# Patient Record
Sex: Female | Born: 1970
Health system: Southern US, Community
[De-identification: ages and names within clinical notes are randomized; demographics above are authoritative.]

## PROBLEM LIST (undated history)

## (undated) DIAGNOSIS — R87629 Unspecified abnormal cytological findings in specimens from vagina: Secondary | ICD-10-CM

## (undated) DIAGNOSIS — E538 Deficiency of other specified B group vitamins: Secondary | ICD-10-CM

## (undated) DIAGNOSIS — T7840XA Allergy, unspecified, initial encounter: Secondary | ICD-10-CM

## (undated) DIAGNOSIS — G4730 Sleep apnea, unspecified: Secondary | ICD-10-CM

## (undated) DIAGNOSIS — F419 Anxiety disorder, unspecified: Secondary | ICD-10-CM

## (undated) DIAGNOSIS — S73004A Unspecified dislocation of right hip, initial encounter: Secondary | ICD-10-CM

## (undated) DIAGNOSIS — E785 Hyperlipidemia, unspecified: Secondary | ICD-10-CM

## (undated) DIAGNOSIS — F32A Depression, unspecified: Secondary | ICD-10-CM

## (undated) DIAGNOSIS — M549 Dorsalgia, unspecified: Secondary | ICD-10-CM

## (undated) DIAGNOSIS — M810 Age-related osteoporosis without current pathological fracture: Secondary | ICD-10-CM

## (undated) DIAGNOSIS — E669 Obesity, unspecified: Secondary | ICD-10-CM

## (undated) DIAGNOSIS — J019 Acute sinusitis, unspecified: Secondary | ICD-10-CM

## (undated) DIAGNOSIS — M2550 Pain in unspecified joint: Secondary | ICD-10-CM

## (undated) DIAGNOSIS — G4700 Insomnia, unspecified: Secondary | ICD-10-CM

## (undated) DIAGNOSIS — E559 Vitamin D deficiency, unspecified: Secondary | ICD-10-CM

## (undated) DIAGNOSIS — Z903 Acquired absence of stomach [part of]: Secondary | ICD-10-CM

## (undated) DIAGNOSIS — Z720 Tobacco use: Secondary | ICD-10-CM

## (undated) DIAGNOSIS — M5090 Cervical disc disorder, unspecified, unspecified cervical region: Secondary | ICD-10-CM

## (undated) DIAGNOSIS — G250 Essential tremor: Secondary | ICD-10-CM

## (undated) DIAGNOSIS — G609 Hereditary and idiopathic neuropathy, unspecified: Secondary | ICD-10-CM

## (undated) DIAGNOSIS — F329 Major depressive disorder, single episode, unspecified: Secondary | ICD-10-CM

## (undated) DIAGNOSIS — IMO0002 Reserved for concepts with insufficient information to code with codable children: Secondary | ICD-10-CM

## (undated) DIAGNOSIS — S82839B Other fracture of upper and lower end of unspecified fibula, initial encounter for open fracture type I or II: Secondary | ICD-10-CM

## (undated) DIAGNOSIS — G252 Other specified forms of tremor: Secondary | ICD-10-CM

## (undated) DIAGNOSIS — K5900 Constipation, unspecified: Secondary | ICD-10-CM

## (undated) HISTORY — DX: Other fracture of upper and lower end of unspecified fibula, initial encounter for open fracture type I or II: S82.839B

## (undated) HISTORY — DX: Tobacco use: Z72.0

## (undated) HISTORY — PX: ARTHROSCOPIC REPAIR ACL: SUR80

## (undated) HISTORY — DX: Essential tremor: G25.0

## (undated) HISTORY — DX: Unspecified abnormal cytological findings in specimens from vagina: R87.629

## (undated) HISTORY — DX: Obesity, unspecified: E66.9

## (undated) HISTORY — DX: Age-related osteoporosis without current pathological fracture: M81.0

## (undated) HISTORY — DX: Cervical disc disorder, unspecified, unspecified cervical region: M50.90

## (undated) HISTORY — DX: Insomnia, unspecified: G47.00

## (undated) HISTORY — DX: Pain in unspecified joint: M25.50

## (undated) HISTORY — DX: Acute sinusitis, unspecified: J01.90

## (undated) HISTORY — DX: Hereditary and idiopathic neuropathy, unspecified: G60.9

## (undated) HISTORY — PX: TUBAL LIGATION: SHX77

## (undated) HISTORY — DX: Acquired absence of stomach (part of): Z90.3

## (undated) HISTORY — DX: Vitamin D deficiency, unspecified: E55.9

## (undated) HISTORY — DX: Deficiency of other specified B group vitamins: E53.8

## (undated) HISTORY — DX: Constipation, unspecified: K59.00

## (undated) HISTORY — DX: Hyperlipidemia, unspecified: E78.5

## (undated) HISTORY — DX: Anxiety disorder, unspecified: F41.9

## (undated) HISTORY — DX: Dorsalgia, unspecified: M54.9

## (undated) HISTORY — DX: Allergy, unspecified, initial encounter: T78.40XA

## (undated) HISTORY — PX: TONSILLECTOMY: SUR1361

## (undated) HISTORY — DX: Essential tremor: G25.2

## (undated) HISTORY — DX: Reserved for concepts with insufficient information to code with codable children: IMO0002

---
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
## 1998-10-18 ENCOUNTER — Other Ambulatory Visit: Admission: RE | Admit: 1998-10-18 | Discharge: 1998-10-18 | Payer: Self-pay | Admitting: Gynecology

## 1999-11-28 ENCOUNTER — Other Ambulatory Visit: Admission: RE | Admit: 1999-11-28 | Discharge: 1999-11-28 | Payer: Self-pay | Admitting: Obstetrics & Gynecology

## 2000-12-02 ENCOUNTER — Other Ambulatory Visit: Admission: RE | Admit: 2000-12-02 | Discharge: 2000-12-02 | Payer: Self-pay | Admitting: Obstetrics and Gynecology

## 2000-12-29 ENCOUNTER — Encounter: Payer: Self-pay | Admitting: Internal Medicine

## 2000-12-29 ENCOUNTER — Ambulatory Visit (HOSPITAL_COMMUNITY): Admission: RE | Admit: 2000-12-29 | Discharge: 2000-12-29 | Payer: Self-pay | Admitting: Internal Medicine

## 2001-12-22 ENCOUNTER — Other Ambulatory Visit: Admission: RE | Admit: 2001-12-22 | Discharge: 2001-12-22 | Payer: Self-pay | Admitting: Obstetrics and Gynecology

## 2003-02-01 ENCOUNTER — Other Ambulatory Visit: Admission: RE | Admit: 2003-02-01 | Discharge: 2003-02-01 | Payer: Self-pay | Admitting: Obstetrics and Gynecology

## 2003-03-04 ENCOUNTER — Ambulatory Visit (HOSPITAL_COMMUNITY): Admission: RE | Admit: 2003-03-04 | Discharge: 2003-03-04 | Payer: Self-pay | Admitting: Obstetrics and Gynecology

## 2004-02-20 ENCOUNTER — Other Ambulatory Visit: Admission: RE | Admit: 2004-02-20 | Discharge: 2004-02-20 | Payer: Self-pay | Admitting: Obstetrics and Gynecology

## 2004-12-19 ENCOUNTER — Inpatient Hospital Stay (HOSPITAL_COMMUNITY): Admission: AD | Admit: 2004-12-19 | Discharge: 2004-12-19 | Payer: Self-pay | Admitting: Obstetrics and Gynecology

## 2005-06-10 ENCOUNTER — Other Ambulatory Visit: Admission: RE | Admit: 2005-06-10 | Discharge: 2005-06-10 | Payer: Self-pay | Admitting: Obstetrics and Gynecology

## 2008-12-16 HISTORY — PX: OTHER SURGICAL HISTORY: SHX169

## 2008-12-20 ENCOUNTER — Inpatient Hospital Stay (HOSPITAL_COMMUNITY): Admission: EM | Admit: 2008-12-20 | Discharge: 2008-12-23 | Payer: Self-pay | Admitting: Emergency Medicine

## 2009-01-30 ENCOUNTER — Encounter: Admission: RE | Admit: 2009-01-30 | Discharge: 2009-01-30 | Payer: Self-pay | Admitting: Orthopedic Surgery

## 2009-03-15 ENCOUNTER — Encounter: Admission: RE | Admit: 2009-03-15 | Discharge: 2009-03-15 | Payer: Self-pay | Admitting: Orthopedic Surgery

## 2011-01-06 ENCOUNTER — Encounter: Payer: Self-pay | Admitting: Orthopedic Surgery

## 2011-04-01 LAB — URINE MICROSCOPIC-ADD ON

## 2011-04-01 LAB — URINALYSIS, ROUTINE W REFLEX MICROSCOPIC
Glucose, UA: NEGATIVE mg/dL
Leukocytes, UA: NEGATIVE
Protein, ur: NEGATIVE mg/dL
Specific Gravity, Urine: 1.043 — ABNORMAL HIGH (ref 1.005–1.030)
pH: 8 (ref 5.0–8.0)

## 2011-04-01 LAB — BASIC METABOLIC PANEL
BUN: 9 mg/dL (ref 6–23)
Calcium: 8.6 mg/dL (ref 8.4–10.5)
Creatinine, Ser: 0.48 mg/dL (ref 0.4–1.2)
GFR calc Af Amer: >60 mL/min (ref >60)
GFR calc non Af Amer: >60 mL/min (ref >60)

## 2011-04-01 LAB — CBC
MCV: 90.4 fL (ref 78.0–100.0)
Platelets: 320 10*3/uL (ref 150–400)
RBC: 4.4 MIL/uL (ref 3.87–5.11)
WBC: 20.9 10*3/uL — ABNORMAL HIGH (ref 4.0–10.5)

## 2011-04-01 LAB — DIFFERENTIAL
Eosinophils Absolute: 0 10*3/uL (ref 0.0–0.7)
Lymphs Abs: 1 10*3/uL (ref 0.7–4.0)
Monocytes Relative: 2 % — ABNORMAL LOW (ref 3–12)
Neutro Abs: 19.4 10*3/uL — ABNORMAL HIGH (ref 1.7–7.7)
Neutrophils Relative %: 93 % — ABNORMAL HIGH (ref 43–77)

## 2011-04-01 LAB — PROTIME-INR
INR: 1.1 (ref 0.00–1.49)
Prothrombin Time: 14.1 seconds (ref 11.6–15.2)

## 2011-04-01 LAB — POCT PREGNANCY, URINE: Preg Test, Ur: NEGATIVE

## 2011-04-01 LAB — APTT: aPTT: 30 seconds (ref 24–37)
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
## 2011-05-03 NOTE — Op Note (Signed)
   [name], [name]                        ACCOUNT NO.:  [id number]   MEDICAL RECORD NO.:  [id number]                   PATIENT TYPE:  AMB   LOCATION:  SDC                                  FACILITY:  [facility]   PHYSICIAN:  Michelle L. Vincente Poli, M.D.            DATE OF BIRTH:  21-Aug-1971   DATE OF PROCEDURE:  03/04/2003  DATE OF DISCHARGE:                                 OPERATIVE REPORT   PREOPERATIVE DIAGNOSIS:  Desires permanent sterilization.   POSTOPERATIVE DIAGNOSIS:  Desires permanent sterilization.   PROCEDURE:  Laparoscopic bilateral tubal ligation.   SURGEON:  Michelle L. Vincente Poli, M.D.   ANESTHESIA:  General.   ESTIMATED BLOOD LOSS:  None.   FINDINGS:  Normal pelvis.   DESCRIPTION OF PROCEDURE:  The patient taken to the operating room after  informed consent was obtained.  The patient was intubated without  difficulty.  The patient was then prepped and draped in the usual sterile  fashion after in-and-out catheter was used to empty the bladder and a  uterine manipulator was inserted inside.  The scalpel was used to make a  infraumbilical incision.  The Veress needle was inserted with one attempt  without difficulty.  A pneumoperitoneum was then achieved.  The Veress  needle was removed and the 10-11 mm trocar was inserted without difficulty.  The laparoscope was introduced and the patient was placed in Trendelenburg  position.  There was no bleeding or intestinal injury noted.  There were no  adhesions noted.  The pelvis appeared very normal.  The adnexa appeared  normal, bilateral fimbriated end were easily seen.  The secondary trocar  site was placed using the 5-mm trocar and use of Kleppingers.  Bilateral  tubal ligation was performed with a triple burn to ensure that the wattage  went down to 0 with very good effect.  Photographs were then taken.  There  was no bleeding noted.  Instruments were removed from the abdomen after  pneumoperitoneum was released.  Using  3-0 Vicryl interrupted, incisions were  then closed and local was then injected at each incision site and pressure  dressing was placed at each incision site.  All sponge, lap, and instrument  counts were correct x2.  The uterine manipulator was removed from the vagina  prior to extubation.  The patient was extubated and went to the recovery  room in stable condition.                                               Michelle L. Vincente Poli, M.D.    [name]  D:  03/04/2003  T:  03/05/2003  Job:  [id number]
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
## 2012-05-21 ENCOUNTER — Emergency Department (HOSPITAL_COMMUNITY)
Admission: EM | Admit: 2012-05-21 | Discharge: 2012-05-21 | Disposition: A | Discharge status: Home / Self Care | Payer: 59 | Source: Home / Self Care | Attending: Emergency Medicine | Admitting: Emergency Medicine

## 2012-05-21 ENCOUNTER — Emergency Department (INDEPENDENT_AMBULATORY_CARE_PROVIDER_SITE_OTHER): Payer: 59

## 2012-05-21 ENCOUNTER — Encounter (HOSPITAL_COMMUNITY): Payer: Self-pay | Admitting: Emergency Medicine

## 2012-05-21 DIAGNOSIS — S8000XA Contusion of unspecified knee, initial encounter: Secondary | ICD-10-CM

## 2012-05-21 DIAGNOSIS — S8010XA Contusion of unspecified lower leg, initial encounter: Secondary | ICD-10-CM

## 2012-05-21 DIAGNOSIS — S8001XA Contusion of right knee, initial encounter: Secondary | ICD-10-CM

## 2012-05-21 DIAGNOSIS — S2020XA Contusion of thorax, unspecified, initial encounter: Secondary | ICD-10-CM

## 2012-05-21 HISTORY — DX: Depression, unspecified: F32.A

## 2012-05-21 HISTORY — DX: Major depressive disorder, single episode, unspecified: F32.9

## 2012-05-21 HISTORY — DX: Unspecified dislocation of right hip, initial encounter: S73.004A

## 2012-05-21 MED ORDER — MELOXICAM 15 MG PO TABS: 15.0000 mg | ORAL_TABLET | Freq: Every day | ORAL | Status: DC

## 2012-05-21 MED ORDER — METAXALONE 800 MG PO TABS: 800.0000 mg | ORAL_TABLET | Freq: Three times a day (TID) | ORAL | Status: AC

## 2012-05-21 MED ORDER — OXYCODONE-ACETAMINOPHEN 5-325 MG PO TABS: 1.0000 | ORAL_TABLET | Freq: Four times a day (QID) | ORAL | Status: AC | PRN
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
## 2012-05-21 NOTE — Discharge Instructions (Signed)
Take the medication as written. Take 1 gram of tylenol with the motrin up to 4 times a day as needed for pain and fever. This with the meloxicam is an effective combination for pain. Take the percocet only for severe pain. Do not take the tylenol and percocet as they both have tylenol in them and too much can hurt your liver. Do not exceed 4 grams of tylenol a day from all sources. Return if you get worse, have a  fever >100.4, or for any concerns.   Go to www.goodrx.com to look up your medications. This will give you a list of where you can find your prescriptions at the most affordable prices.   

## 2012-05-21 NOTE — ED Notes (Signed)
Crutches not fitted and splint not applied prior to dc at pt request, as she is a [employer] fund[employer] herself and has to drive herself home (not able w splint on knee) related she felt comfortable w the idea of adjusting and application the appliances herself
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
## 2012-05-22 ENCOUNTER — Telehealth (HOSPITAL_COMMUNITY): Payer: Self-pay | Admitting: *Deleted

## 2012-05-22 NOTE — ED Notes (Signed)
Pt. called for work note. Dr. told her 2 days but note not in her papers. I told her she could come pick it up at the front desk.  Discussed with Dr. Chaney Malling and she approved 2 days. Note done as directed and put at the front desk. [name] 05/22/2012

## 2012-05-25 ENCOUNTER — Ambulatory Visit
Admission: RE | Admit: 2012-05-25 | Discharge: 2012-05-25 | Disposition: A | Discharge status: Home / Self Care | Payer: 59 | Source: Ambulatory Visit | Attending: Chiropractic Medicine | Admitting: Chiropractic Medicine

## 2012-05-25 ENCOUNTER — Other Ambulatory Visit: Payer: Self-pay | Admitting: Chiropractic Medicine

## 2012-05-25 DIAGNOSIS — M5412 Radiculopathy, cervical region: Secondary | ICD-10-CM

## 2012-05-25 DIAGNOSIS — M545 Low back pain, unspecified: Secondary | ICD-10-CM

## 2012-05-25 DIAGNOSIS — M25551 Pain in right hip: Secondary | ICD-10-CM

## 2012-05-25 DIAGNOSIS — R079 Chest pain, unspecified: Secondary | ICD-10-CM

## 2012-06-23 ENCOUNTER — Encounter: Payer: Self-pay | Admitting: Specialist

## 2012-07-16 ENCOUNTER — Encounter: Payer: Self-pay | Admitting: Specialist

## 2013-03-30 ENCOUNTER — Other Ambulatory Visit: Payer: Self-pay | Admitting: *Deleted

## 2013-03-30 ENCOUNTER — Other Ambulatory Visit: Payer: 59

## 2013-03-30 DIAGNOSIS — Z13220 Encounter for screening for lipoid disorders: Secondary | ICD-10-CM

## 2013-03-30 DIAGNOSIS — M948X9 Other specified disorders of cartilage, unspecified sites: Secondary | ICD-10-CM

## 2013-03-30 DIAGNOSIS — I10 Essential (primary) hypertension: Secondary | ICD-10-CM

## 2013-03-30 DIAGNOSIS — J301 Allergic rhinitis due to pollen: Secondary | ICD-10-CM

## 2013-03-31 LAB — CBC WITH DIFFERENTIAL/PLATELET
Eos: 2 % (ref 0–5)
HCT: 39.7 % (ref 34.0–46.6)
Immature Grans (Abs): 0 10*3/uL (ref 0.0–0.1)
Immature Granulocytes: 0 % (ref 0–2)
Lymphocytes Absolute: 2.2 10*3/uL (ref 0.7–3.1)
Monocytes: 8 % (ref 4–12)
Neutrophils Relative %: 60 % (ref 40–74)
RDW: 13.3 % (ref 12.3–15.4)
WBC: 7.3 10*3/uL (ref 3.4–10.8)

## 2013-03-31 LAB — COMPREHENSIVE METABOLIC PANEL
ALT: 7 IU/L (ref 0–32)
Albumin/Globulin Ratio: 1.9 (ref 1.1–2.5)
Alkaline Phosphatase: 59 IU/L (ref 39–117)
BUN/Creatinine Ratio: 16 (ref 9–23)
GFR calc Af Amer: 126 mL/min/{1.73_m2} (ref >59)
GFR calc non Af Amer: 110 mL/min/{1.73_m2} (ref >59)
Potassium: 5.1 mmol/L (ref 3.5–5.2)
Sodium: 142 mmol/L (ref 134–144)
Total Bilirubin: 0.3 mg/dL (ref 0.0–1.2)
Total Protein: 6.1 g/dL (ref 6.0–8.5)

## 2013-03-31 LAB — LIPID PANEL: VLDL Cholesterol Cal: 21 mg/dL (ref 5–40)

## 2013-03-31 LAB — VITAMIN D 25 HYDROXY (VIT D DEFICIENCY, FRACTURES): Vit D, 25-Hydroxy: 18.5 ng/mL — ABNORMAL LOW (ref 30.0–100.0)

## 2013-04-12 ENCOUNTER — Encounter: Payer: Self-pay | Admitting: *Deleted

## 2013-04-13 ENCOUNTER — Ambulatory Visit (INDEPENDENT_AMBULATORY_CARE_PROVIDER_SITE_OTHER): Payer: 59 | Admitting: Internal Medicine

## 2013-04-13 ENCOUNTER — Encounter: Payer: Self-pay | Admitting: Internal Medicine

## 2013-04-13 VITALS — BP 124/72 | HR 67 | Temp 98.6°F | Resp 14 | Ht 61.0 in | Wt 213.0 lb

## 2013-04-13 DIAGNOSIS — F32A Depression, unspecified: Secondary | ICD-10-CM

## 2013-04-13 DIAGNOSIS — E559 Vitamin D deficiency, unspecified: Secondary | ICD-10-CM

## 2013-04-13 DIAGNOSIS — F329 Major depressive disorder, single episode, unspecified: Secondary | ICD-10-CM

## 2013-04-13 DIAGNOSIS — F17200 Nicotine dependence, unspecified, uncomplicated: Secondary | ICD-10-CM

## 2013-04-13 DIAGNOSIS — Z720 Tobacco use: Secondary | ICD-10-CM

## 2013-04-13 DIAGNOSIS — Z0000 Encounter for general adult medical examination without abnormal findings: Secondary | ICD-10-CM

## 2013-04-13 DIAGNOSIS — G4700 Insomnia, unspecified: Secondary | ICD-10-CM

## 2013-04-13 DIAGNOSIS — F411 Generalized anxiety disorder: Secondary | ICD-10-CM

## 2013-04-13 DIAGNOSIS — F3289 Other specified depressive episodes: Secondary | ICD-10-CM

## 2013-04-13 MED ORDER — SERTRALINE HCL 100 MG PO TABS: 100.0000 mg | ORAL_TABLET | Freq: Every day | ORAL | Status: DC

## 2013-04-13 MED ORDER — VITAMIN D3 1.25 MG (50000 UT) PO CAPS: 1.0000 | ORAL_CAPSULE | ORAL | Status: DC
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
## 2013-04-14 DIAGNOSIS — E559 Vitamin D deficiency, unspecified: Secondary | ICD-10-CM | POA: Insufficient documentation

## 2013-04-14 DIAGNOSIS — F329 Major depressive disorder, single episode, unspecified: Secondary | ICD-10-CM | POA: Insufficient documentation

## 2013-04-14 DIAGNOSIS — Z720 Tobacco use: Secondary | ICD-10-CM | POA: Insufficient documentation

## 2013-04-14 DIAGNOSIS — F32A Depression, unspecified: Secondary | ICD-10-CM | POA: Insufficient documentation

## 2013-04-14 DIAGNOSIS — F411 Generalized anxiety disorder: Secondary | ICD-10-CM | POA: Insufficient documentation

## 2013-04-14 DIAGNOSIS — G4700 Insomnia, unspecified: Secondary | ICD-10-CM | POA: Insufficient documentation

## 2013-04-14 DIAGNOSIS — Z0000 Encounter for general adult medical examination without abnormal findings: Secondary | ICD-10-CM | POA: Insufficient documentation

## 2013-06-19 ENCOUNTER — Other Ambulatory Visit: Payer: Self-pay | Admitting: Nurse Practitioner

## 2013-06-22 ENCOUNTER — Other Ambulatory Visit: Payer: Self-pay | Admitting: Geriatric Medicine

## 2013-06-22 ENCOUNTER — Other Ambulatory Visit: Payer: Self-pay | Admitting: Nurse Practitioner

## 2013-09-04 ENCOUNTER — Other Ambulatory Visit: Payer: Self-pay | Admitting: Internal Medicine

## 2013-09-10 ENCOUNTER — Other Ambulatory Visit: Payer: Self-pay | Admitting: Internal Medicine

## 2013-10-12 ENCOUNTER — Encounter: Payer: Self-pay | Admitting: Internal Medicine

## 2013-10-12 ENCOUNTER — Ambulatory Visit (INDEPENDENT_AMBULATORY_CARE_PROVIDER_SITE_OTHER): Payer: 59 | Admitting: Internal Medicine

## 2013-10-12 VITALS — BP 130/86 | HR 94 | Temp 98.2°F | Resp 16 | Wt 222.2 lb

## 2013-10-12 DIAGNOSIS — G4700 Insomnia, unspecified: Secondary | ICD-10-CM

## 2013-10-12 DIAGNOSIS — F17200 Nicotine dependence, unspecified, uncomplicated: Secondary | ICD-10-CM

## 2013-10-12 DIAGNOSIS — F32A Depression, unspecified: Secondary | ICD-10-CM

## 2013-10-12 DIAGNOSIS — E559 Vitamin D deficiency, unspecified: Secondary | ICD-10-CM

## 2013-10-12 DIAGNOSIS — Z720 Tobacco use: Secondary | ICD-10-CM

## 2013-10-12 DIAGNOSIS — F3289 Other specified depressive episodes: Secondary | ICD-10-CM

## 2013-10-12 DIAGNOSIS — F329 Major depressive disorder, single episode, unspecified: Secondary | ICD-10-CM

## 2013-10-12 DIAGNOSIS — F411 Generalized anxiety disorder: Secondary | ICD-10-CM

## 2013-10-12 MED ORDER — ALPRAZOLAM 0.5 MG PO TABS: ORAL_TABLET | ORAL | Status: DC

## 2013-10-12 MED ORDER — ZOLPIDEM TARTRATE 10 MG PO TABS: ORAL_TABLET | ORAL | Status: DC

## 2013-10-12 MED ORDER — SERTRALINE HCL 100 MG PO TABS
150.0000 mg | ORAL_TABLET | Freq: Every day | ORAL | Status: DC
Start: 2013-10-12 — End: 2014-01-26
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
## 2013-12-16 DIAGNOSIS — IMO0002 Reserved for concepts with insufficient information to code with codable children: Secondary | ICD-10-CM

## 2013-12-16 HISTORY — DX: Reserved for concepts with insufficient information to code with codable children: IMO0002

## 2013-12-24 ENCOUNTER — Other Ambulatory Visit: Payer: Self-pay | Admitting: Internal Medicine

## 2013-12-24 DIAGNOSIS — J069 Acute upper respiratory infection, unspecified: Secondary | ICD-10-CM

## 2013-12-24 MED ORDER — AZITHROMYCIN 250 MG PO TABS: ORAL_TABLET | ORAL | Status: DC

## 2014-01-26 ENCOUNTER — Ambulatory Visit (INDEPENDENT_AMBULATORY_CARE_PROVIDER_SITE_OTHER): Payer: 59 | Admitting: Internal Medicine

## 2014-01-26 ENCOUNTER — Encounter: Payer: Self-pay | Admitting: Internal Medicine

## 2014-01-26 VITALS — BP 122/70 | HR 92 | Temp 98.5°F | Resp 18 | Ht 61.0 in | Wt 224.8 lb

## 2014-01-26 DIAGNOSIS — G4700 Insomnia, unspecified: Secondary | ICD-10-CM

## 2014-01-26 DIAGNOSIS — R200 Anesthesia of skin: Secondary | ICD-10-CM | POA: Insufficient documentation

## 2014-01-26 DIAGNOSIS — M62838 Other muscle spasm: Secondary | ICD-10-CM

## 2014-01-26 DIAGNOSIS — R202 Paresthesia of skin: Secondary | ICD-10-CM

## 2014-01-26 DIAGNOSIS — M542 Cervicalgia: Secondary | ICD-10-CM

## 2014-01-26 DIAGNOSIS — F411 Generalized anxiety disorder: Secondary | ICD-10-CM

## 2014-01-26 DIAGNOSIS — R209 Unspecified disturbances of skin sensation: Secondary | ICD-10-CM

## 2014-01-26 MED ORDER — OXYCODONE-ACETAMINOPHEN 5-325 MG PO TABS: 1.0000 | ORAL_TABLET | Freq: Three times a day (TID) | ORAL | Status: DC | PRN

## 2014-01-26 MED ORDER — CYCLOBENZAPRINE HCL 5 MG PO TABS: 5.0000 mg | ORAL_TABLET | Freq: Three times a day (TID) | ORAL | Status: DC | PRN
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
## 2014-01-27 LAB — BASIC METABOLIC PANEL
BUN/Creatinine Ratio: 17 (ref 9–23)
BUN: 9 mg/dL (ref 6–24)
CHLORIDE: 101 mmol/L (ref 97–108)
CO2: 24 mmol/L (ref 18–29)
Calcium: 9.2 mg/dL (ref 8.7–10.2)
Creatinine, Ser: 0.52 mg/dL — ABNORMAL LOW (ref 0.57–1.00)
GFR calc Af Amer: 136 mL/min/{1.73_m2} (ref >59)
GFR calc non Af Amer: 118 mL/min/{1.73_m2} (ref >59)
Glucose: 105 mg/dL — ABNORMAL HIGH (ref 65–99)
Potassium: 3.9 mmol/L (ref 3.5–5.2)
Sodium: 142 mmol/L (ref 134–144)

## 2014-02-01 ENCOUNTER — Inpatient Hospital Stay: Admission: RE | Admit: 2014-02-01 | Payer: 59 | Source: Ambulatory Visit

## 2014-02-01 ENCOUNTER — Ambulatory Visit: Payer: 59 | Admitting: Internal Medicine

## 2014-02-02 ENCOUNTER — Ambulatory Visit: Payer: 59 | Admitting: Internal Medicine

## 2014-02-04 ENCOUNTER — Ambulatory Visit
Admission: RE | Admit: 2014-02-04 | Discharge: 2014-02-04 | Disposition: A | Discharge status: Home / Self Care | Payer: 59 | Source: Ambulatory Visit | Attending: Internal Medicine | Admitting: Internal Medicine

## 2014-02-04 DIAGNOSIS — M542 Cervicalgia: Secondary | ICD-10-CM

## 2014-04-11 ENCOUNTER — Other Ambulatory Visit: Payer: 59

## 2014-04-11 ENCOUNTER — Other Ambulatory Visit: Payer: Self-pay | Admitting: *Deleted

## 2014-04-11 DIAGNOSIS — I10 Essential (primary) hypertension: Secondary | ICD-10-CM

## 2014-04-11 DIAGNOSIS — G250 Essential tremor: Secondary | ICD-10-CM

## 2014-04-11 DIAGNOSIS — F329 Major depressive disorder, single episode, unspecified: Secondary | ICD-10-CM

## 2014-04-11 DIAGNOSIS — E669 Obesity, unspecified: Secondary | ICD-10-CM

## 2014-04-11 DIAGNOSIS — G252 Other specified forms of tremor: Secondary | ICD-10-CM

## 2014-04-11 DIAGNOSIS — F3289 Other specified depressive episodes: Secondary | ICD-10-CM

## 2014-04-12 ENCOUNTER — Other Ambulatory Visit: Payer: 59

## 2014-04-12 DIAGNOSIS — E669 Obesity, unspecified: Secondary | ICD-10-CM

## 2014-04-12 DIAGNOSIS — F329 Major depressive disorder, single episode, unspecified: Secondary | ICD-10-CM

## 2014-04-12 DIAGNOSIS — I10 Essential (primary) hypertension: Secondary | ICD-10-CM

## 2014-04-12 DIAGNOSIS — G250 Essential tremor: Secondary | ICD-10-CM

## 2014-04-12 DIAGNOSIS — G252 Other specified forms of tremor: Secondary | ICD-10-CM

## 2014-04-12 DIAGNOSIS — F3289 Other specified depressive episodes: Secondary | ICD-10-CM

## 2014-04-13 ENCOUNTER — Ambulatory Visit (INDEPENDENT_AMBULATORY_CARE_PROVIDER_SITE_OTHER): Payer: 59 | Admitting: Internal Medicine

## 2014-04-13 ENCOUNTER — Encounter: Payer: Self-pay | Admitting: Internal Medicine

## 2014-04-13 VITALS — BP 130/86 | HR 85 | Temp 98.3°F | Resp 16 | Ht 62.0 in | Wt 227.2 lb

## 2014-04-13 DIAGNOSIS — E559 Vitamin D deficiency, unspecified: Secondary | ICD-10-CM

## 2014-04-13 DIAGNOSIS — F341 Dysthymic disorder: Secondary | ICD-10-CM

## 2014-04-13 DIAGNOSIS — Z0000 Encounter for general adult medical examination without abnormal findings: Secondary | ICD-10-CM

## 2014-04-13 DIAGNOSIS — F489 Nonpsychotic mental disorder, unspecified: Secondary | ICD-10-CM

## 2014-04-13 DIAGNOSIS — F5105 Insomnia due to other mental disorder: Secondary | ICD-10-CM

## 2014-04-13 DIAGNOSIS — F17200 Nicotine dependence, unspecified, uncomplicated: Secondary | ICD-10-CM

## 2014-04-13 DIAGNOSIS — E785 Hyperlipidemia, unspecified: Secondary | ICD-10-CM | POA: Insufficient documentation

## 2014-04-13 DIAGNOSIS — F418 Other specified anxiety disorders: Secondary | ICD-10-CM

## 2014-04-13 DIAGNOSIS — Z720 Tobacco use: Secondary | ICD-10-CM

## 2014-04-13 LAB — CBC WITH DIFFERENTIAL/PLATELET
BASOS ABS: 0 10*3/uL (ref 0.0–0.2)
Basos: 0 %
Eos: 2 %
Eosinophils Absolute: 0.2 10*3/uL (ref 0.0–0.4)
HCT: 39.9 % (ref 34.0–46.6)
HEMOGLOBIN: 13.4 g/dL (ref 11.1–15.9)
Immature Grans (Abs): 0 10*3/uL (ref 0.0–0.1)
Immature Granulocytes: 0 %
LYMPHS ABS: 2.3 10*3/uL (ref 0.7–3.1)
LYMPHS: 28 %
MCH: 30 pg (ref 26.6–33.0)
MCHC: 33.6 g/dL (ref 31.5–35.7)
MCV: 90 fL (ref 79–97)
Monocytes Absolute: 0.6 10*3/uL (ref 0.1–0.9)
Monocytes: 8 %
NEUTROS ABS: 5.1 10*3/uL (ref 1.4–7.0)
Neutrophils Relative %: 62 %
RBC: 4.46 x10E6/uL (ref 3.77–5.28)
RDW: 12.8 % (ref 12.3–15.4)
WBC: 8.3 10*3/uL (ref 3.4–10.8)

## 2014-04-13 LAB — COMPREHENSIVE METABOLIC PANEL
ALBUMIN: 4.2 g/dL (ref 3.5–5.5)
ALK PHOS: 66 IU/L (ref 39–117)
ALT: 9 IU/L (ref 0–32)
AST: 13 IU/L (ref 0–40)
Albumin/Globulin Ratio: 1.7 (ref 1.1–2.5)
BUN / CREAT RATIO: 24 — AB (ref 9–23)
BUN: 14 mg/dL (ref 6–24)
CALCIUM: 9.6 mg/dL (ref 8.7–10.2)
CHLORIDE: 103 mmol/L (ref 97–108)
CO2: 25 mmol/L (ref 18–29)
Creatinine, Ser: 0.59 mg/dL (ref 0.57–1.00)
GFR calc Af Amer: 131 mL/min/{1.73_m2} (ref >59)
GFR calc non Af Amer: 113 mL/min/{1.73_m2} (ref >59)
GLUCOSE: 96 mg/dL (ref 65–99)
Globulin, Total: 2.5 g/dL (ref 1.5–4.5)
Potassium: 5.1 mmol/L (ref 3.5–5.2)
Sodium: 143 mmol/L (ref 134–144)
TOTAL PROTEIN: 6.7 g/dL (ref 6.0–8.5)
Total Bilirubin: 0.4 mg/dL (ref 0.0–1.2)

## 2014-04-13 LAB — LIPID PANEL
CHOLESTEROL TOTAL: 212 mg/dL — AB (ref 100–199)
Chol/HDL Ratio: 3.2 ratio units (ref 0.0–4.4)
HDL: 66 mg/dL (ref >39)
LDL CALC: 125 mg/dL — AB (ref 0–99)
TRIGLYCERIDES: 104 mg/dL (ref 0–149)
VLDL Cholesterol Cal: 21 mg/dL (ref 5–40)

## 2014-04-13 LAB — VITAMIN D 25 HYDROXY (VIT D DEFICIENCY, FRACTURES): Vit D, 25-Hydroxy: 19.1 ng/mL — ABNORMAL LOW (ref 30.0–100.0)

## 2014-04-13 LAB — HEMOGLOBIN A1C
Est. average glucose Bld gHb Est-mCnc: 111 mg/dL
HEMOGLOBIN A1C: 5.5 % (ref 4.8–5.6)

## 2014-04-13 MED ORDER — CALCIUM CARBONATE-VITAMIN D 500-200 MG-UNIT PO TABS: 1.0000 | ORAL_TABLET | Freq: Two times a day (BID) | ORAL | Status: DC

## 2014-04-13 MED ORDER — ALPRAZOLAM 0.5 MG PO TABS: 0.5000 mg | ORAL_TABLET | Freq: Three times a day (TID) | ORAL | Status: DC | PRN

## 2014-04-13 MED ORDER — ZOLPIDEM TARTRATE 10 MG PO TABS: ORAL_TABLET | ORAL | Status: DC

## 2014-04-13 MED ORDER — VITAMIN D (ERGOCALCIFEROL) 1.25 MG (50000 UNIT) PO CAPS: 50000.0000 [IU] | ORAL_CAPSULE | ORAL | Status: DC

## 2014-04-13 MED ORDER — SERTRALINE HCL 100 MG PO TABS: 200.0000 mg | ORAL_TABLET | Freq: Every day | ORAL | Status: DC
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
## 2014-04-15 ENCOUNTER — Encounter: Payer: Self-pay | Admitting: *Deleted

## 2014-04-19 ENCOUNTER — Other Ambulatory Visit: Payer: Self-pay | Admitting: *Deleted

## 2014-04-19 MED ORDER — ALPRAZOLAM 0.5 MG PO TABS: 0.5000 mg | ORAL_TABLET | Freq: Three times a day (TID) | ORAL | Status: DC | PRN

## 2014-04-20 LAB — SPECIMEN STATUS REPORT

## 2014-04-21 LAB — SPECIMEN STATUS REPORT

## 2014-04-21 LAB — TSH: TSH: 1.19 u[IU]/mL (ref 0.450–4.500)

## 2014-08-24 ENCOUNTER — Other Ambulatory Visit: Payer: Self-pay | Admitting: *Deleted

## 2014-08-24 MED ORDER — ALPRAZOLAM 0.5 MG PO TABS: 0.5000 mg | ORAL_TABLET | Freq: Three times a day (TID) | ORAL | Status: DC | PRN

## 2014-10-17 ENCOUNTER — Other Ambulatory Visit: Payer: Self-pay | Admitting: Internal Medicine

## 2014-10-17 MED ORDER — ZOLPIDEM TARTRATE 10 MG PO TABS: ORAL_TABLET | ORAL | Status: DC

## 2015-01-12 ENCOUNTER — Other Ambulatory Visit: Payer: Self-pay | Admitting: Internal Medicine

## 2015-01-23 ENCOUNTER — Encounter: Payer: Self-pay | Admitting: Internal Medicine

## 2015-01-23 ENCOUNTER — Ambulatory Visit (INDEPENDENT_AMBULATORY_CARE_PROVIDER_SITE_OTHER): Payer: BLUE CROSS/BLUE SHIELD | Admitting: Internal Medicine

## 2015-01-23 VITALS — BP 132/58 | HR 103 | Temp 98.9°F | Resp 18 | Ht 62.0 in | Wt 231.0 lb

## 2015-01-23 DIAGNOSIS — S8411XD Injury of peroneal nerve at lower leg level, right leg, subsequent encounter: Secondary | ICD-10-CM

## 2015-01-23 DIAGNOSIS — S8410XA Injury of peroneal nerve at lower leg level, unspecified leg, initial encounter: Secondary | ICD-10-CM | POA: Insufficient documentation

## 2015-01-23 DIAGNOSIS — M21371 Foot drop, right foot: Secondary | ICD-10-CM

## 2015-01-23 DIAGNOSIS — F5102 Adjustment insomnia: Secondary | ICD-10-CM

## 2015-01-23 MED ORDER — SUVOREXANT 10 MG PO TABS: 10.0000 mg | ORAL_TABLET | Freq: Every evening | ORAL | Status: DC | PRN
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
## 2015-01-23 NOTE — Patient Instructions (Signed)
Let's try belsomra 10mg .  I gave you a coupon card.  Let me know how it goes.  There are higher doses if 10mg  is inadequate.

## 2015-03-03 ENCOUNTER — Other Ambulatory Visit: Payer: Self-pay | Admitting: Internal Medicine

## 2015-03-06 ENCOUNTER — Other Ambulatory Visit: Payer: Self-pay | Admitting: *Deleted

## 2015-03-06 MED ORDER — ALPRAZOLAM 0.5 MG PO TABS: 0.5000 mg | ORAL_TABLET | Freq: Three times a day (TID) | ORAL | Status: DC | PRN

## 2015-03-06 NOTE — Telephone Encounter (Signed)
[facility] [address] 

## 2015-04-06 ENCOUNTER — Other Ambulatory Visit: Payer: Self-pay | Admitting: Internal Medicine

## 2015-04-10 ENCOUNTER — Telehealth: Payer: Self-pay | Admitting: *Deleted

## 2015-04-10 NOTE — Telephone Encounter (Signed)
Patient called and stated that she wants the Ambien back because the Belsomra is not working and causes crazy dreams. Please Advise.

## 2015-04-10 NOTE — Telephone Encounter (Signed)
Resume ambien 10mg  at bedtime.  Do not take xanax at the same time.

## 2015-04-11 MED ORDER — ZOLPIDEM TARTRATE 10 MG PO TABS: 10.0000 mg | ORAL_TABLET | Freq: Every evening | ORAL | Status: DC | PRN

## 2015-04-11 NOTE — Telephone Encounter (Signed)
Patient Notified and Rx printed and faxed to pharmacy.

## 2015-05-10 ENCOUNTER — Other Ambulatory Visit: Payer: Self-pay | Admitting: Nurse Practitioner

## 2015-06-05 ENCOUNTER — Other Ambulatory Visit: Payer: Self-pay | Admitting: Nurse Practitioner

## 2015-07-23 ENCOUNTER — Other Ambulatory Visit: Payer: Self-pay | Admitting: Internal Medicine

## 2015-08-28 ENCOUNTER — Other Ambulatory Visit: Payer: Self-pay | Admitting: Internal Medicine

## 2015-12-01 ENCOUNTER — Encounter: Payer: Self-pay | Admitting: Internal Medicine

## 2015-12-01 ENCOUNTER — Ambulatory Visit (INDEPENDENT_AMBULATORY_CARE_PROVIDER_SITE_OTHER): Payer: BLUE CROSS/BLUE SHIELD | Admitting: Internal Medicine

## 2015-12-01 VITALS — BP 110/62 | HR 91 | Temp 98.2°F | Resp 12 | Ht 62.5 in | Wt 241.0 lb

## 2015-12-01 DIAGNOSIS — E559 Vitamin D deficiency, unspecified: Secondary | ICD-10-CM | POA: Diagnosis not present

## 2015-12-01 DIAGNOSIS — F4329 Adjustment disorder with other symptoms: Secondary | ICD-10-CM

## 2015-12-01 DIAGNOSIS — M21371 Foot drop, right foot: Secondary | ICD-10-CM

## 2015-12-01 DIAGNOSIS — Z720 Tobacco use: Secondary | ICD-10-CM

## 2015-12-01 DIAGNOSIS — G4700 Insomnia, unspecified: Secondary | ICD-10-CM | POA: Diagnosis not present

## 2015-12-01 MED ORDER — ALPRAZOLAM 0.5 MG PO TABS: 0.5000 mg | ORAL_TABLET | Freq: Three times a day (TID) | ORAL | Status: DC | PRN

## 2015-12-01 MED ORDER — ZOLPIDEM TARTRATE 10 MG PO TABS: 10.0000 mg | ORAL_TABLET | Freq: Every evening | ORAL | Status: DC | PRN

## 2015-12-01 MED ORDER — SERTRALINE HCL 100 MG PO TABS: ORAL_TABLET | ORAL | Status: DC
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
## 2015-12-02 LAB — CBC WITH DIFFERENTIAL/PLATELET
BASOS ABS: 0 10*3/uL (ref 0.0–0.2)
Basos: 0 %
EOS (ABSOLUTE): 0.2 10*3/uL (ref 0.0–0.4)
Eos: 2 %
Hematocrit: 38.8 % (ref 34.0–46.6)
Hemoglobin: 13.5 g/dL (ref 11.1–15.9)
Immature Grans (Abs): 0 10*3/uL (ref 0.0–0.1)
Immature Granulocytes: 0 %
LYMPHS ABS: 3.4 10*3/uL — AB (ref 0.7–3.1)
Lymphs: 27 %
MCH: 29.7 pg (ref 26.6–33.0)
MCHC: 34.8 g/dL (ref 31.5–35.7)
MCV: 86 fL (ref 79–97)
Monocytes Absolute: 0.8 10*3/uL (ref 0.1–0.9)
Monocytes: 7 %
NEUTROS ABS: 8 10*3/uL — AB (ref 1.4–7.0)
Neutrophils: 64 %
PLATELETS: 409 10*3/uL — AB (ref 150–379)
RBC: 4.54 x10E6/uL (ref 3.77–5.28)
RDW: 13.7 % (ref 12.3–15.4)
WBC: 12.5 10*3/uL — AB (ref 3.4–10.8)

## 2015-12-02 LAB — COMPREHENSIVE METABOLIC PANEL
A/G RATIO: 1.9 (ref 1.1–2.5)
ALBUMIN: 4.4 g/dL (ref 3.5–5.5)
ALK PHOS: 71 IU/L (ref 39–117)
ALT: 8 IU/L (ref 0–32)
AST: 13 IU/L (ref 0–40)
BILIRUBIN TOTAL: 0.2 mg/dL (ref 0.0–1.2)
BUN / CREAT RATIO: 25 — AB (ref 9–23)
BUN: 15 mg/dL (ref 6–24)
CHLORIDE: 103 mmol/L (ref 96–106)
CO2: 19 mmol/L (ref 18–29)
Calcium: 9.1 mg/dL (ref 8.7–10.2)
Creatinine, Ser: 0.61 mg/dL (ref 0.57–1.00)
GFR calc non Af Amer: 111 mL/min/{1.73_m2} (ref >59)
GFR, EST AFRICAN AMERICAN: 128 mL/min/{1.73_m2} (ref >59)
GLUCOSE: 81 mg/dL (ref 65–99)
Globulin, Total: 2.3 g/dL (ref 1.5–4.5)
POTASSIUM: 3.9 mmol/L (ref 3.5–5.2)
Sodium: 141 mmol/L (ref 134–144)
TOTAL PROTEIN: 6.7 g/dL (ref 6.0–8.5)

## 2015-12-02 LAB — TSH: TSH: 1.2 u[IU]/mL (ref 0.450–4.500)

## 2015-12-02 LAB — VITAMIN D 25 HYDROXY (VIT D DEFICIENCY, FRACTURES): Vit D, 25-Hydroxy: 18.1 ng/mL — ABNORMAL LOW (ref 30.0–100.0)

## 2015-12-03 NOTE — Patient Instructions (Signed)
Work note given from Dec 19 - Dec 23   Cont current meds as ordered  Follow up in at least 6 mos to [employer] call with lab results

## 2016-02-08 ENCOUNTER — Encounter: Payer: Self-pay | Admitting: Internal Medicine

## 2016-03-01 ENCOUNTER — Other Ambulatory Visit: Payer: Self-pay | Admitting: *Deleted

## 2016-03-01 MED ORDER — ALPRAZOLAM 0.5 MG PO TABS: 0.5000 mg | ORAL_TABLET | Freq: Three times a day (TID) | ORAL | Status: DC | PRN

## 2016-03-01 NOTE — Telephone Encounter (Signed)
[facility] outpatient pharmacy([address] [place] [phone]

## 2016-05-07 DIAGNOSIS — S0501XA Injury of conjunctiva and corneal abrasion without foreign body, right eye, initial encounter: Secondary | ICD-10-CM | POA: Diagnosis not present

## 2016-05-28 ENCOUNTER — Other Ambulatory Visit: Payer: Self-pay | Admitting: *Deleted

## 2016-05-28 MED ORDER — ALPRAZOLAM 0.5 MG PO TABS: 0.5000 mg | ORAL_TABLET | Freq: Three times a day (TID) | ORAL | Status: DC | PRN

## 2016-05-28 NOTE — Telephone Encounter (Signed)
[facility].

## 2016-07-04 ENCOUNTER — Other Ambulatory Visit: Payer: Self-pay

## 2016-07-04 MED ORDER — ZOLPIDEM TARTRATE 10 MG PO TABS: 10.0000 mg | ORAL_TABLET | Freq: Every evening | ORAL | Status: DC | PRN

## 2016-07-04 NOTE — Telephone Encounter (Signed)
Rx printed, given to provider for signature then to be faxed

## 2016-08-08 ENCOUNTER — Other Ambulatory Visit: Payer: Self-pay | Admitting: Certified Nurse Midwife

## 2016-08-08 DIAGNOSIS — H5213 Myopia, bilateral: Secondary | ICD-10-CM | POA: Diagnosis not present

## 2016-08-08 DIAGNOSIS — A630 Anogenital (venereal) warts: Secondary | ICD-10-CM | POA: Diagnosis not present

## 2016-08-08 DIAGNOSIS — H52223 Regular astigmatism, bilateral: Secondary | ICD-10-CM | POA: Diagnosis not present

## 2016-08-08 DIAGNOSIS — E669 Obesity, unspecified: Secondary | ICD-10-CM | POA: Diagnosis not present

## 2016-08-08 DIAGNOSIS — Z01419 Encounter for gynecological examination (general) (routine) without abnormal findings: Secondary | ICD-10-CM | POA: Diagnosis not present

## 2016-08-08 DIAGNOSIS — Z124 Encounter for screening for malignant neoplasm of cervix: Secondary | ICD-10-CM | POA: Diagnosis not present

## 2016-08-08 DIAGNOSIS — H524 Presbyopia: Secondary | ICD-10-CM | POA: Diagnosis not present

## 2016-08-08 DIAGNOSIS — Z1231 Encounter for screening mammogram for malignant neoplasm of breast: Secondary | ICD-10-CM

## 2016-08-08 LAB — HM PAP SMEAR: HM PAP: NEGATIVE

## 2016-08-15 ENCOUNTER — Ambulatory Visit (INDEPENDENT_AMBULATORY_CARE_PROVIDER_SITE_OTHER): Payer: 59 | Admitting: Family Medicine

## 2016-08-15 ENCOUNTER — Encounter: Payer: Self-pay | Admitting: Family Medicine

## 2016-08-15 VITALS — BP 126/79 | HR 94 | Resp 18 | Ht 62.0 in | Wt 238.0 lb

## 2016-08-15 DIAGNOSIS — F418 Other specified anxiety disorders: Secondary | ICD-10-CM | POA: Diagnosis not present

## 2016-08-15 DIAGNOSIS — Z7689 Persons encountering health services in other specified circumstances: Secondary | ICD-10-CM

## 2016-08-15 DIAGNOSIS — F341 Dysthymic disorder: Secondary | ICD-10-CM | POA: Diagnosis not present

## 2016-08-15 DIAGNOSIS — Z8241 Family history of sudden cardiac death: Secondary | ICD-10-CM | POA: Insufficient documentation

## 2016-08-15 DIAGNOSIS — F5105 Insomnia due to other mental disorder: Secondary | ICD-10-CM

## 2016-08-15 DIAGNOSIS — Z7189 Other specified counseling: Secondary | ICD-10-CM | POA: Diagnosis not present

## 2016-08-15 DIAGNOSIS — E559 Vitamin D deficiency, unspecified: Secondary | ICD-10-CM

## 2016-08-15 DIAGNOSIS — Z720 Tobacco use: Secondary | ICD-10-CM

## 2016-08-15 MED ORDER — ALPRAZOLAM 0.5 MG PO TABS: 0.5000 mg | ORAL_TABLET | Freq: Three times a day (TID) | ORAL | 1 refills | Status: DC | PRN

## 2016-08-15 MED ORDER — PHENTERMINE HCL 15 MG PO CAPS: 15.0000 mg | ORAL_CAPSULE | ORAL | 0 refills | Status: DC
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
## 2016-08-15 NOTE — Patient Instructions (Addendum)
Need GYN records  Congratulations on your effort to exercise more and eat better Take medicine as directed  See me in six months  Take the vitamin D 2000 U a day  Call for problems

## 2016-09-03 ENCOUNTER — Ambulatory Visit
Admission: RE | Admit: 2016-09-03 | Discharge: 2016-09-03 | Disposition: A | Discharge status: Home / Self Care | Payer: 59 | Source: Ambulatory Visit | Attending: Certified Nurse Midwife | Admitting: Certified Nurse Midwife

## 2016-09-03 ENCOUNTER — Other Ambulatory Visit: Payer: Self-pay | Admitting: Certified Nurse Midwife

## 2016-09-03 DIAGNOSIS — Z1231 Encounter for screening mammogram for malignant neoplasm of breast: Secondary | ICD-10-CM

## 2016-09-05 ENCOUNTER — Inpatient Hospital Stay
Admission: RE | Admit: 2016-09-05 | Discharge: 2016-09-05 | Disposition: A | Discharge status: Home / Self Care | Payer: Self-pay | Source: Ambulatory Visit | Attending: *Deleted | Admitting: *Deleted

## 2016-09-05 ENCOUNTER — Other Ambulatory Visit: Payer: Self-pay | Admitting: *Deleted

## 2016-09-05 DIAGNOSIS — Z9289 Personal history of other medical treatment: Secondary | ICD-10-CM

## 2016-09-10 ENCOUNTER — Encounter: Payer: Self-pay | Admitting: Family Medicine

## 2016-11-26 ENCOUNTER — Encounter: Payer: Self-pay | Admitting: Family Medicine

## 2016-12-03 ENCOUNTER — Telehealth: Payer: Self-pay

## 2016-12-03 ENCOUNTER — Other Ambulatory Visit: Payer: Self-pay | Admitting: Family Medicine

## 2016-12-03 DIAGNOSIS — F411 Generalized anxiety disorder: Secondary | ICD-10-CM

## 2016-12-03 NOTE — Telephone Encounter (Signed)
Called [name], aware of referral.

## 2016-12-31 ENCOUNTER — Encounter: Payer: Self-pay | Admitting: Family Medicine

## 2017-01-03 ENCOUNTER — Other Ambulatory Visit: Payer: Self-pay | Admitting: Family Medicine

## 2017-01-03 MED ORDER — SERTRALINE HCL 100 MG PO TABS: ORAL_TABLET | ORAL | 6 refills | Status: DC

## 2017-01-03 MED ORDER — ZOLPIDEM TARTRATE 10 MG PO TABS: 10.0000 mg | ORAL_TABLET | Freq: Every evening | ORAL | 0 refills | Status: DC | PRN

## 2017-02-12 ENCOUNTER — Ambulatory Visit (INDEPENDENT_AMBULATORY_CARE_PROVIDER_SITE_OTHER): Payer: 59 | Admitting: Family Medicine

## 2017-02-12 ENCOUNTER — Encounter: Payer: Self-pay | Admitting: Family Medicine

## 2017-02-12 DIAGNOSIS — Z720 Tobacco use: Secondary | ICD-10-CM

## 2017-02-12 DIAGNOSIS — F418 Other specified anxiety disorders: Secondary | ICD-10-CM | POA: Diagnosis not present

## 2017-02-12 DIAGNOSIS — F5105 Insomnia due to other mental disorder: Secondary | ICD-10-CM | POA: Diagnosis not present

## 2017-02-12 MED ORDER — ALPRAZOLAM 0.5 MG PO TABS: 0.5000 mg | ORAL_TABLET | Freq: Two times a day (BID) | ORAL | 0 refills | Status: DC | PRN

## 2017-02-12 MED ORDER — SERTRALINE HCL 100 MG PO TABS: ORAL_TABLET | ORAL | 2 refills | Status: DC

## 2017-02-12 MED ORDER — ZOLPIDEM TARTRATE 10 MG PO TABS: 10.0000 mg | ORAL_TABLET | Freq: Every evening | ORAL | 5 refills | Status: DC | PRN

## 2017-02-12 NOTE — Patient Instructions (Signed)
Continue the zoloft Ambien at night Xanax as needed Try to cut down on the smoking Walk every day that you are able Please call again to see about counseling  See me in 3-4 months for a physical need labs next time

## 2017-02-12 NOTE — Progress Notes (Signed)
Chief Complaint  Patient presents with  . Follow-up    6 month   Here for follow up Weight is stable - feels unable to lose Depression and anxiety are controlled - barely - and she has been unable to get an appointment with counseling.  she misses her xanax - and requests refill.  I discussed with her that it is best used PRN for panic attacks and uncontrolled anxiety than depending on it daily.  I will give her #20 only until she can see counselor. Still smokes.  Does not feel able to quit.  Discussed the health implications - she is a Therapist, sports so knows the risks. No regular exercise Health maintenance is up to date Discussed daily ambien.  Cannot sleep without it.  She has been on a number of alternatives.  No side effects, falls, daytime drowsiness or amnesia.  Patient Active Problem List   Diagnosis Date Noted  . Family history of death due to heart problem at 60 years of age or younger 08/15/2016  . Right foot drop 01/23/2015  . 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 Severe obesity (BMI >= 40) ([facility]) 04/13/2014  . Insomnia secondary to depression with anxiety 04/13/2014  . Depression with anxiety 04/13/2014  . Other and unspecified hyperlipidemia 04/13/2014  . Positive test for human papillomavirus (HPV) 12/16/2013  . Vitamin D deficiency 04/14/2013  . Tobacco user 04/14/2013    Outpatient Encounter Prescriptions as of 02/12/2017  Medication Sig  . sertraline (ZOLOFT) 100 MG tablet TAKE TWO TABLETS BY MOUTH ONCE DAILY FOR MOOD  . zolpidem (AMBIEN) 10 MG tablet Take 1 tablet (10 mg total) by mouth at bedtime as needed. for sleep  . ALPRAZolam (XANAX) 0.5 MG tablet Take 1 tablet (0.5 mg total) by mouth 2 (two) times daily as needed. for anxiety   No facility-administered encounter medications on file as of 02/12/2017.     Allergies  Allergen Reactions  . Penicillins     Had rash as a child,  (has recently tolerated Augmentin without problems )     Review of Systems    Constitutional: Positive for fatigue. Negative for activity change, appetite change and unexpected weight change.  HENT: Negative for congestion, postnasal drip and rhinorrhea.   Eyes: Negative for redness and visual disturbance.  Respiratory: Negative for cough and shortness of breath.   Cardiovascular: Negative for chest pain and palpitations.  Gastrointestinal: Negative for constipation and diarrhea.  Endocrine: Negative for cold intolerance and heat intolerance.  Genitourinary: Negative for difficulty urinating, frequency and menstrual problem.  Musculoskeletal: Positive for back pain and joint swelling.       Intermittent  Neurological: Negative for dizziness and headaches.  Psychiatric/Behavioral: Positive for decreased concentration, dysphoric mood and sleep disturbance. The patient is nervous/anxious.     BP 130/78 (BP Location: Right Arm, Patient Position: Sitting, Cuff Size: Large)   Pulse 84   Temp 98.4 F (36.9 C) (Temporal)   Resp 16   Ht 5\' 2"  (1.575 m)   Wt 239 lb 1.3 oz (108.4 kg)   SpO2 96%   BMI 43.73 kg/m   Physical Exam  Constitutional: She is oriented to person, place, and time. She appears well-developed and well-nourished.  Obese.    HENT:  Head: Normocephalic and atraumatic.  Right Ear: External ear normal.  Left Ear: External ear normal.  Mouth/Throat: Oropharynx is clear and moist.  Eyes: Conjunctivae are normal. Pupils are equal, round, and reactive to light.  Neck: Normal range  of motion. Neck supple. No thyromegaly present.  Cardiovascular: Normal rate, regular rhythm and normal heart sounds.   Pulmonary/Chest: Effort normal and breath sounds normal. No respiratory distress.  Abdominal: Soft. Bowel sounds are normal.  Musculoskeletal: Normal range of motion. She exhibits edema.  trace  Lymphadenopathy:    She has no cervical adenopathy.  Neurological: She is alert and oriented to person, place, and time.  Gait normal  Skin: Skin is warm and  dry.  Psychiatric: She has a normal mood and affect. Her behavior is normal. Thought content normal.  Depressed mood.  Anxious at times.  Nursing note and vitals reviewed.   ASSESSMENT/PLAN:  1. Morbid obesity, unspecified obesity type ([facility]) Unchanged.  Discussed diet and exercise.  2. Depression with anxiety Continue zoloft.  Xanax for emergencies only.  See counselor  3. Insomnia secondary to depression with anxiety Continue ambien  4. Tobacco user QUIT   Patient Instructions  Continue the zoloft Ambien at night Xanax as needed Try to cut down on the smoking Walk every day that you are able Please call again to see about counseling  See me in 3-4 months for a physical need labs next time    Raylene Everts, MD

## 2017-04-21 ENCOUNTER — Encounter: Payer: Self-pay | Admitting: Family Medicine

## 2017-04-21 ENCOUNTER — Telehealth: Payer: Self-pay

## 2017-04-22 ENCOUNTER — Telehealth: Payer: Self-pay

## 2017-04-22 ENCOUNTER — Ambulatory Visit (INDEPENDENT_AMBULATORY_CARE_PROVIDER_SITE_OTHER): Payer: 59 | Admitting: Family Medicine

## 2017-04-22 ENCOUNTER — Encounter: Payer: Self-pay | Admitting: Family Medicine

## 2017-04-22 VITALS — BP 118/70 | HR 72 | Temp 97.1°F | Resp 16 | Ht 62.0 in | Wt 239.0 lb

## 2017-04-22 DIAGNOSIS — B309 Viral conjunctivitis, unspecified: Secondary | ICD-10-CM

## 2017-04-22 NOTE — Progress Notes (Signed)
Chief Complaint  Patient presents with  . Eye Drainage    left   Left eye first, then right eye, red, uncomfortable, tearing and light sensitive No visual impairment No itching No purulence No other URI sumptoms  Patient Active Problem List   Diagnosis Date Noted  . Family history of death due to heart problem at 45 years of age or younger 08/15/2016  . Right foot drop 01/23/2015  . 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 Severe obesity (BMI >= 40) ([facility]) 04/13/2014  . Insomnia secondary to depression with anxiety 04/13/2014  . Depression with anxiety 04/13/2014  . Other and unspecified hyperlipidemia 04/13/2014  . Positive test for human papillomavirus (HPV) 12/16/2013  . Vitamin D deficiency 04/14/2013  . Tobacco user 04/14/2013    Outpatient Encounter Prescriptions as of 04/22/2017  Medication Sig  . ALPRAZolam (XANAX) 0.5 MG tablet Take 1 tablet (0.5 mg total) by mouth 2 (two) times daily as needed. for anxiety  . sertraline (ZOLOFT) 100 MG tablet TAKE TWO TABLETS BY MOUTH ONCE DAILY FOR MOOD  . zolpidem (AMBIEN) 10 MG tablet Take 1 tablet (10 mg total) by mouth at bedtime as needed. for sleep   No facility-administered encounter medications on file as of 04/22/2017.     Allergies  Allergen Reactions  . Penicillins     Had rash as a child,  (has recently tolerated Augmentin without problems )     Review of Systems  Constitutional: Negative for activity change, appetite change, chills and fever.  HENT: Negative for congestion and rhinorrhea.   Eyes: Positive for photophobia, discharge and redness. Negative for pain, itching and visual disturbance.  Respiratory: Negative for cough and shortness of breath.   Hematological: Negative for adenopathy. Does not bruise/bleed easily.  Psychiatric/Behavioral: Negative for dysphoric mood and sleep disturbance.    BP 118/70 (BP Location: Right Arm, Patient Position: Sitting, Cuff Size: Large)   Pulse 72   Temp 97.1 F (36.2  C) (Temporal)   Resp 16   Ht 5\' 2"  (1.575 m)   Wt 239 lb 0.6 oz (108.4 kg)   SpO2 98%   BMI 43.72 kg/m   Physical Exam  Constitutional: She is oriented to person, place, and time. She appears well-developed and well-nourished. No distress.  HENT:  Head: Normocephalic and atraumatic.  Right Ear: External ear normal.  Left Ear: External ear normal.  Nose: Nose normal.  Mouth/Throat: Oropharynx is clear and moist.  Eyes: Pupils are equal, round, and reactive to light. Right eye exhibits discharge. Left eye exhibits discharge.  L>R conjunctival injection with scant mucoid discharge  Neck: Normal range of motion.  Cardiovascular: Normal rate and regular rhythm.   Pulmonary/Chest: Effort normal and breath sounds normal.  Lymphadenopathy:    She has no cervical adenopathy.  Neurological: She is alert and oriented to person, place, and time.  Psychiatric: She has a normal mood and affect. Thought content normal.    ASSESSMENT/PLAN:  1. Viral conjunctivitis of both eyes    Patient Instructions  Viral Conjunctivitis, Adult Viral conjunctivitis is an inflammation of the clear membrane that covers the white part of your eye and the inner surface of your eyelid (conjunctiva). The inflammation is caused by a viral infection. The blood vessels in the conjunctiva become inflamed, causing the eye to become red or pink, and often itchy. Viral conjunctivitis can be easily passed from one person to another (is contagious). This condition is often called pink eye. What are the causes? This  condition is caused by a virus. A virus is a type of contagious germ. It can be spread by touching objects that have been contaminated with the virus, such as doorknobs or towels. It can also be passed through droplets, such as from coughing or sneezing. What are the signs or symptoms? Symptoms of this condition include:  Eye redness.  Tearing or watery eyes.  Itchy and irritated eyes.  Burning feeling  in the eyes.  Clear drainage from the eye.  Swollen eyelids.  A gritty feeling in the eye.  Light sensitivity. This condition often occurs with other symptoms, such as a fever, nausea, or a rash. How is this diagnosed? This condition is diagnosed with a medical history and physical exam. If you have discharge from your eye, the discharge may be tested to rule out other causes of conjunctivitis. How is this treated? Viral conjunctivitis does not respond to medicines that kill bacteria (antibiotics). Treatment for viral conjunctivitis is directed at stopping a bacterial infection from developing in addition to the viral infection. Treatment also aims to relieve your symptoms, such as itching. This may be done with antihistamine drops or other eye medicines. Rarely, steroid eye drops or antiviral medicines may be prescribed. Follow these instructions at home: Medicines    Take or apply over-the-counter and prescription medicines only as told by your health care provider.  Be very careful to avoid touching the edge of the eyelid with the eye drop bottle or ointment tube when applying medicines to the affected eye. Being careful this way will stop you from spreading the infection to the other eye or to other people. Eye care   Avoid touching or rubbing your eyes.  Apply a warm, wet, clean washcloth to your eye for 10-20 minutes, 3-4 times per day or as told by your health care provider.  If you wear contact lenses, do not wear them until the inflammation is gone and your health care provider says it is safe to wear them again. Ask your health care provider how to sterilize or replace your contact lenses before using them again. Wear glasses until you can resume wearing contacts.  Avoid wearing eye makeup until the inflammation is gone. Throw away any old eye cosmetics that may be contaminated.  Gently wipe away any drainage from your eye with a warm, wet washcloth or a cotton ball. General  instructions   Change or wash your pillowcase every day or as told by your health care provider.  Do not share towels, pillowcases, washcloths, eye makeup, makeup brushes, contact lenses, or glasses. This may spread the infection.  Wash your hands often with soap and water. Use paper towels to dry your hands. If soap and water are not available, use hand sanitizer.  Try to avoid contact with other people for one week or as told by your health care provider. Contact a health care provider if:  Your symptoms do not improve with treatment or they get worse.  You have increased pain.  Your vision becomes blurry.  You have a fever.  You have facial pain, redness, or swelling.  You have yellow or green drainage coming from your eye.  You have new symptoms. This information is not intended to replace advice given to you by your health care provider. Make sure you discuss any questions you have with your health care provider. Document Released: 02/22/2003 Document Revised: 06/29/2016 Document Reviewed: 06/18/2016 Elsevier Interactive Patient Education  2017 [place],  MD

## 2017-04-22 NOTE — Telephone Encounter (Signed)
Called [facility], has appt for 245 today.

## 2017-04-22 NOTE — Telephone Encounter (Signed)
Called pt. Coming in at 245 today to be seen.

## 2017-04-22 NOTE — Patient Instructions (Addendum)
Viral Conjunctivitis, Adult Viral conjunctivitis is an inflammation of the clear membrane that covers the white part of your eye and the inner surface of your eyelid (conjunctiva). The inflammation is caused by a viral infection. The blood vessels in the conjunctiva become inflamed, causing the eye to become red or pink, and often itchy. Viral conjunctivitis can be easily passed from one person to another (is contagious). This condition is often called pink eye. What are the causes? This condition is caused by a virus. A virus is a type of contagious germ. It can be spread by touching objects that have been contaminated with the virus, such as doorknobs or towels. It can also be passed through droplets, such as from coughing or sneezing. What are the signs or symptoms? Symptoms of this condition include:  Eye redness.  Tearing or watery eyes.  Itchy and irritated eyes.  Burning feeling in the eyes.  Clear drainage from the eye.  Swollen eyelids.  A gritty feeling in the eye.  Light sensitivity. This condition often occurs with other symptoms, such as a fever, nausea, or a rash. How is this diagnosed? This condition is diagnosed with a medical history and physical exam. If you have discharge from your eye, the discharge may be tested to rule out other causes of conjunctivitis. How is this treated? Viral conjunctivitis does not respond to medicines that kill bacteria (antibiotics). Treatment for viral conjunctivitis is directed at stopping a bacterial infection from developing in addition to the viral infection. Treatment also aims to relieve your symptoms, such as itching. This may be done with antihistamine drops or other eye medicines. Rarely, steroid eye drops or antiviral medicines may be prescribed. Follow these instructions at home: Medicines    Take or apply over-the-counter and prescription medicines only as told by your health care provider.  Be very careful to avoid touching  the edge of the eyelid with the eye drop bottle or ointment tube when applying medicines to the affected eye. Being careful this way will stop you from spreading the infection to the other eye or to other people. Eye care   Avoid touching or rubbing your eyes.  Apply a warm, wet, clean washcloth to your eye for 10-20 minutes, 3-4 times per day or as told by your health care provider.  If you wear contact lenses, do not wear them until the inflammation is gone and your health care provider says it is safe to wear them again. Ask your health care provider how to sterilize or replace your contact lenses before using them again. Wear glasses until you can resume wearing contacts.  Avoid wearing eye makeup until the inflammation is gone. Throw away any old eye cosmetics that may be contaminated.  Gently wipe away any drainage from your eye with a warm, wet washcloth or a cotton ball. General instructions   Change or wash your pillowcase every day or as told by your health care provider.  Do not share towels, pillowcases, washcloths, eye makeup, makeup brushes, contact lenses, or glasses. This may spread the infection.  Wash your hands often with soap and water. Use paper towels to dry your hands. If soap and water are not available, use hand sanitizer.  Try to avoid contact with other people for one week or as told by your health care provider. Contact a health care provider if:  Your symptoms do not improve with treatment or they get worse.  You have increased pain.  Your vision becomes blurry.  You   have a fever.  You have facial pain, redness, or swelling.  You have yellow or green drainage coming from your eye.  You have new symptoms. This information is not intended to replace advice given to you by your health care provider. Make sure you discuss any questions you have with your health care provider. Document Released: 02/22/2003 Document Revised: 06/29/2016 Document Reviewed:  06/18/2016 Elsevier Interactive Patient Education  2017 [employer]  

## 2017-04-22 NOTE — Telephone Encounter (Signed)
appt 245 today.

## 2017-05-16 DIAGNOSIS — M21371 Foot drop, right foot: Secondary | ICD-10-CM | POA: Diagnosis not present

## 2017-05-16 DIAGNOSIS — M7671 Peroneal tendinitis, right leg: Secondary | ICD-10-CM | POA: Diagnosis not present

## 2017-05-16 DIAGNOSIS — M7661 Achilles tendinitis, right leg: Secondary | ICD-10-CM | POA: Diagnosis not present

## 2017-05-22 ENCOUNTER — Ambulatory Visit: Payer: 59 | Admitting: Family Medicine

## 2017-05-30 DIAGNOSIS — M7661 Achilles tendinitis, right leg: Secondary | ICD-10-CM | POA: Diagnosis not present

## 2017-06-03 DIAGNOSIS — M7661 Achilles tendinitis, right leg: Secondary | ICD-10-CM | POA: Diagnosis not present

## 2017-06-05 ENCOUNTER — Ambulatory Visit (INDEPENDENT_AMBULATORY_CARE_PROVIDER_SITE_OTHER): Payer: 59 | Admitting: Family Medicine

## 2017-06-05 ENCOUNTER — Encounter: Payer: Self-pay | Admitting: Family Medicine

## 2017-06-05 VITALS — BP 124/86 | HR 74 | Temp 97.6°F | Resp 16 | Ht 62.0 in | Wt 240.0 lb

## 2017-06-05 DIAGNOSIS — F418 Other specified anxiety disorders: Secondary | ICD-10-CM

## 2017-06-05 DIAGNOSIS — Z8241 Family history of sudden cardiac death: Secondary | ICD-10-CM

## 2017-06-05 DIAGNOSIS — Z720 Tobacco use: Secondary | ICD-10-CM | POA: Diagnosis not present

## 2017-06-05 DIAGNOSIS — Z0000 Encounter for general adult medical examination without abnormal findings: Secondary | ICD-10-CM | POA: Diagnosis not present

## 2017-06-05 LAB — COMPLETE METABOLIC PANEL WITH GFR
ALK PHOS: 66 U/L (ref 33–115)
ALT: 7 U/L (ref 6–29)
AST: 12 U/L (ref 10–35)
Albumin: 4 g/dL (ref 3.6–5.1)
BUN: 14 mg/dL (ref 7–25)
CALCIUM: 9.1 mg/dL (ref 8.6–10.2)
CHLORIDE: 105 mmol/L (ref 98–110)
CO2: 25 mmol/L (ref 20–31)
Creat: 0.69 mg/dL (ref 0.50–1.10)
GFR, Est African American: >89 mL/min (ref >=60)
GFR, Est Non African American: >89 mL/min (ref >=60)
Glucose, Bld: 92 mg/dL (ref 65–99)
Potassium: 4.2 mmol/L (ref 3.5–5.3)
Sodium: 139 mmol/L (ref 135–146)
TOTAL PROTEIN: 6.6 g/dL (ref 6.1–8.1)
Total Bilirubin: 0.5 mg/dL (ref 0.2–1.2)

## 2017-06-05 LAB — URINALYSIS, MICROSCOPIC ONLY
Bacteria, UA: NONE SEEN [HPF]
Casts: NONE SEEN [LPF]
Crystals: NONE SEEN [HPF]
WBC, UA: NONE SEEN WBC/HPF (ref <=5)
Yeast: NONE SEEN [HPF]

## 2017-06-05 LAB — URINALYSIS, ROUTINE W REFLEX MICROSCOPIC
Bilirubin Urine: NEGATIVE
Glucose, UA: NEGATIVE
Ketones, ur: NEGATIVE
Leukocytes, UA: NEGATIVE
Nitrite: NEGATIVE
Protein, ur: NEGATIVE
Specific Gravity, Urine: 1.014 (ref 1.001–1.035)
pH: 7.5 (ref 5.0–8.0)

## 2017-06-05 LAB — LIPID PANEL
CHOL/HDL RATIO: 3.4 ratio (ref <5.0)
Cholesterol: 196 mg/dL (ref <200)
HDL: 57 mg/dL (ref >50)
LDL Cholesterol: 119 mg/dL — ABNORMAL HIGH (ref <100)
TRIGLYCERIDES: 99 mg/dL (ref <150)
VLDL: 20 mg/dL (ref <30)

## 2017-06-05 LAB — CBC
HEMATOCRIT: 39.1 % (ref 35.0–45.0)
Hemoglobin: 13.3 g/dL (ref 11.7–15.5)
MCH: 29.6 pg (ref 27.0–33.0)
MCHC: 34 g/dL (ref 32.0–36.0)
MCV: 86.9 fL (ref 80.0–100.0)
MPV: 9.2 fL (ref 7.5–12.5)
Platelets: 328 10*3/uL (ref 140–400)
RBC: 4.5 MIL/uL (ref 3.80–5.10)
RDW: 13.6 % (ref 11.0–15.0)
WBC: 7.5 10*3/uL (ref 3.8–10.8)

## 2017-06-05 LAB — TSH: TSH: 0.73 mIU/L

## 2017-06-05 NOTE — Progress Notes (Signed)
Chief Complaint  Patient presents with  . Annual Exam   Here for annual exam Up to date with PAP Still aware of obesity and need to lose weight.  Discussed diet and exercise recommendations Due for lab screening Health maintenance up to date Immunizations current Depression/anxiety/insomnia is controlled   Patient Active Problem List   Diagnosis Date Noted  . Family history of death due to heart problem at 45 years of age or younger 08/15/2016  . Right foot drop 01/23/2015  . 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 Severe obesity (BMI >= 40) ([facility]) 04/13/2014  . Insomnia secondary to depression with anxiety 04/13/2014  . Depression with anxiety 04/13/2014  . Other and unspecified hyperlipidemia 04/13/2014  . Positive test for human papillomavirus (HPV) 12/16/2013  . Vitamin D deficiency 04/14/2013  . Tobacco user 04/14/2013    Outpatient Encounter Prescriptions as of 06/05/2017  Medication Sig  . sertraline (ZOLOFT) 100 MG tablet TAKE TWO TABLETS BY MOUTH ONCE DAILY FOR MOOD  . zolpidem (AMBIEN) 10 MG tablet Take 1 tablet (10 mg total) by mouth at bedtime as needed. for sleep  . [DISCONTINUED] ALPRAZolam (XANAX) 0.5 MG tablet Take 1 tablet (0.5 mg total) by mouth 2 (two) times daily as needed. for anxiety (Patient not taking: Reported on 06/05/2017)   No facility-administered encounter medications on file as of 06/05/2017.     Allergies  Allergen Reactions  . Penicillins     Had rash as a child,  (has recently tolerated Augmentin without problems )     Review of Systems  Constitutional: Negative for activity change, appetite change and unexpected weight change.  HENT: Negative for congestion, dental problem, postnasal drip and rhinorrhea.   Eyes: Negative for redness and visual disturbance.  Respiratory: Negative for cough and shortness of breath.   Cardiovascular: Negative for chest pain, palpitations and leg swelling.  Gastrointestinal: Negative for abdominal pain,  constipation and diarrhea.  Genitourinary: Negative for difficulty urinating, frequency and menstrual problem.  Musculoskeletal: Negative for arthralgias and back pain.  Neurological: Negative for dizziness and headaches.  Psychiatric/Behavioral: Negative for dysphoric mood and sleep disturbance. The patient is not nervous/anxious.     BP 124/86 (BP Location: Right Arm, Patient Position: Sitting, Cuff Size: Large)   Pulse 74   Temp 97.6 F (36.4 C) (Temporal)   Resp 16   Ht 5\' 2"  (1.575 m)   Wt 240 lb (108.9 kg)   SpO2 96%   BMI 43.90 kg/m   Physical Exam BP 124/86 (BP Location: Right Arm, Patient Position: Sitting, Cuff Size: Large)   Pulse 74   Temp 97.6 F (36.4 C) (Temporal)   Resp 16   Ht 5\' 2"  (1.575 m)   Wt 240 lb (108.9 kg)   SpO2 96%   BMI 43.90 kg/m   General Appearance:    Alert, cooperative, no distress, appears stated age. Obese.  Discussed sun exposure/sunscreen  Head:    Normocephalic, without obvious abnormality, atraumatic  Eyes:    PERRL, conjunctiva/corneas clear, EOM's intact, fundi    benign, both eyes  Ears:    Normal TM's and external ear canals, both ears  Nose:   Nares normal, septum midline, mucosa normal, no drainage    or sinus tenderness  Throat:   Lips, mucosa, and tongue normal; teeth and gums normal  Neck:   Supple, symmetrical, trachea midline, no adenopathy;    thyroid:  no enlargement/tenderness/nodules; no carotid   bruit or JVD  Back:  Symmetric, no curvature, ROM normal, no CVA tenderness  Lungs:     Clear to auscultation bilaterally, respirations unlabored  Chest Wall:    No tenderness or deformity   Heart:    Regular rate and rhythm, S1 and S2 normal, no murmur, rub   or gallop  Breast Exam:    Defer GYN  Abdomen:     Soft, non-tender, bowel sounds active all four quadrants,    no masses, no organomegaly  Genitalia:    Defer GYN  Extremities:   Extremities normal, atraumatic, no cyanosis or edema. Limited ROM right ankle    Pulses:   2+ and symmetric all extremities  Skin:   Skin color, texture, turgor normal, no rashes or lesions  Lymph nodes:   Cervical, supraclavicular, and axillary nodes normal  Neurologic:   CNII-XII intact, normal strength, sensation and reflexes    throughout    ASSESSMENT/PLAN:  1. Tobacco user Discussed cessation  2. Severe obesity (BMI >= 40) ([facility])  - CBC - COMPLETE METABOLIC PANEL WITH GFR - Lipid panel - VITAMIN D 25 Hydroxy (Vit-D Deficiency, Fractures) - Urinalysis, Routine w reflex microscopic - TSH  3. Annual physical exam   4. Family history of death due to heart problem at 59 years of age or younger   94. Depression with anxiety controlled   Patient Instructions  Labs today Try to quit smoking See me yearly   Raylene Everts, MD

## 2017-06-05 NOTE — Patient Instructions (Signed)
Labs today Try to quit smoking See me yearly

## 2017-06-06 DIAGNOSIS — M7661 Achilles tendinitis, right leg: Secondary | ICD-10-CM | POA: Diagnosis not present

## 2017-06-06 LAB — VITAMIN D 25 HYDROXY (VIT D DEFICIENCY, FRACTURES): Vit D, 25-Hydroxy: 25 ng/mL — ABNORMAL LOW (ref 30–100)

## 2017-06-09 DIAGNOSIS — M7661 Achilles tendinitis, right leg: Secondary | ICD-10-CM | POA: Diagnosis not present

## 2017-06-13 DIAGNOSIS — M7661 Achilles tendinitis, right leg: Secondary | ICD-10-CM | POA: Diagnosis not present

## 2017-06-16 DIAGNOSIS — M7661 Achilles tendinitis, right leg: Secondary | ICD-10-CM | POA: Diagnosis not present

## 2017-06-16 DIAGNOSIS — Z720 Tobacco use: Secondary | ICD-10-CM | POA: Diagnosis not present

## 2017-08-05 ENCOUNTER — Encounter: Payer: Self-pay | Admitting: Family Medicine

## 2017-08-11 ENCOUNTER — Encounter: Payer: Self-pay | Admitting: Family Medicine

## 2017-08-11 DIAGNOSIS — H524 Presbyopia: Secondary | ICD-10-CM | POA: Diagnosis not present

## 2017-08-11 DIAGNOSIS — H5213 Myopia, bilateral: Secondary | ICD-10-CM | POA: Diagnosis not present

## 2017-08-11 DIAGNOSIS — K219 Gastro-esophageal reflux disease without esophagitis: Secondary | ICD-10-CM | POA: Insufficient documentation

## 2017-08-12 DIAGNOSIS — E559 Vitamin D deficiency, unspecified: Secondary | ICD-10-CM | POA: Diagnosis not present

## 2017-08-12 DIAGNOSIS — F419 Anxiety disorder, unspecified: Secondary | ICD-10-CM | POA: Diagnosis not present

## 2017-08-12 DIAGNOSIS — R0683 Snoring: Secondary | ICD-10-CM | POA: Diagnosis not present

## 2017-08-12 DIAGNOSIS — F329 Major depressive disorder, single episode, unspecified: Secondary | ICD-10-CM | POA: Diagnosis not present

## 2017-08-12 DIAGNOSIS — Z720 Tobacco use: Secondary | ICD-10-CM | POA: Diagnosis not present

## 2017-08-13 ENCOUNTER — Encounter: Payer: Self-pay | Admitting: Family Medicine

## 2017-08-13 ENCOUNTER — Other Ambulatory Visit: Payer: Self-pay | Admitting: General Surgery

## 2017-08-20 ENCOUNTER — Other Ambulatory Visit (HOSPITAL_COMMUNITY): Payer: Self-pay | Admitting: General Surgery

## 2017-08-23 DIAGNOSIS — F509 Eating disorder, unspecified: Secondary | ICD-10-CM | POA: Diagnosis not present

## 2017-08-25 ENCOUNTER — Other Ambulatory Visit (HOSPITAL_COMMUNITY): Payer: Self-pay | Admitting: General Surgery

## 2017-09-08 ENCOUNTER — Encounter: Payer: Self-pay | Admitting: Registered"

## 2017-09-08 ENCOUNTER — Encounter: Payer: 59 | Attending: General Surgery | Admitting: Registered"

## 2017-09-08 DIAGNOSIS — Z6841 Body Mass Index (BMI) 40.0 and over, adult: Secondary | ICD-10-CM | POA: Diagnosis not present

## 2017-09-08 DIAGNOSIS — Z713 Dietary counseling and surveillance: Secondary | ICD-10-CM | POA: Diagnosis not present

## 2017-09-08 DIAGNOSIS — F419 Anxiety disorder, unspecified: Secondary | ICD-10-CM | POA: Insufficient documentation

## 2017-09-08 DIAGNOSIS — E669 Obesity, unspecified: Secondary | ICD-10-CM | POA: Diagnosis not present

## 2017-09-08 DIAGNOSIS — F329 Major depressive disorder, single episode, unspecified: Secondary | ICD-10-CM | POA: Insufficient documentation

## 2017-09-08 NOTE — Progress Notes (Signed)
Pre-Op Assessment Visit:  Pre-Operative Sleeve gastrectomy Surgery  Medical Nutrition Therapy:  Appt start time: 11:00  End time:  12:05  Patient was seen on 09/08/2017 for Pre-Operative Nutrition Assessment. Assessment and letter of approval faxed to [facility] Surgery Bariatric Surgery Program coordinator on 09/08/2017.   Pt expectation of surgery: lose wight to move around better and spend more time with family  Pt expectation of Dietitian: being able to answer nutrition questions, guidance in time of need  Start weight at [facility]: 239.0 BMI: 44.43   Pt states she has a very supportive family. Pt states her husband has multiple sclerosis where diet and exercise are improtant. Pt states they are working together. Pt states she quit smoking 6 days ago and wears a patch sometimes. Pt states she does not drink water often. Pt is a Retail buyer. Pt states she does not fry food at home; eats only grilled or roasted items. Pt states she eats a lot of fast food. Pt states she has 5 dogs and 2 cats that keep her up at night taking them outside. Pt states she used to snack on items during their nightly trips outside, but has recently stopped.   Per insurance, pt needs 0 SWL visits prior to surgery.    24 hr Dietary Recall: First Meal: skips during the week, diet soda Snack: pop tart Second Meal: fast food-chicken, okra, corn bread, pintos Snack: candy or apples or yogurt and peanut butter Third Meal: chicken or salmon with instant potatoes, non-starchy vegetables Snack: none Beverages: diet soda, tea (occasionally), water (rarely)  Encouraged to engage in 75 minutes of moderate physical activity including cardiovascular and weight baring weekly  Handouts given during visit include:  . Pre-Op Goals . Bariatric Surgery Protein Shakes . Vitamin and Mineral Recommendations  During the appointment today the following Pre-Op Goals were reviewed with the patient: . Maintain or lose  weight as instructed by your surgeon . Make healthy food choices . Begin to limit portion sizes . Limited concentrated sugars and fried foods . Keep fat/sugar in the single digits per serving on food labels . Practice CHEWING your food  (aim for 30 chews per bite or until applesauce consistency) . Practice not drinking 15 minutes before, during, and 30 minutes after each meal/snack . Avoid all carbonated beverages  . Avoid/limit caffeinated beverages  . Avoid all sugar-sweetened beverages . Consume 3 meals per day; eat every 3-5 hours . Make a list of non-food related activities . Aim for 64-100 ounces of FLUID daily  . Aim for at least 60-80 grams of PROTEIN daily . Look for a liquid protein source that contain ?15 g protein and ?5 g carbohydrate  (ex: shakes, drinks, shots) . Physical activity is an important part of a healthy lifestyle so keep it moving!  Follow diet recommendations listed below Energy and Macronutrient Recommendations: Calories: 1500 Carbohydrate: 170 Protein: 112 Fat: 42  Demonstrated degree of understanding via:  Teach Back   Teaching Method Utilized:  Visual Auditory Hands on  Barriers to learning/adherence to lifestyle change: none  Patient to call the Nutrition and Diabetes Education Services to enroll in Pre-Op and Post-Op Nutrition Education when surgery date is scheduled.

## 2017-09-10 ENCOUNTER — Ambulatory Visit (HOSPITAL_COMMUNITY)
Admission: RE | Admit: 2017-09-10 | Discharge: 2017-09-10 | Disposition: A | Discharge status: Home / Self Care | Payer: 59 | Source: Ambulatory Visit | Attending: General Surgery | Admitting: General Surgery

## 2017-09-10 ENCOUNTER — Other Ambulatory Visit: Payer: Self-pay

## 2017-09-10 DIAGNOSIS — R9431 Abnormal electrocardiogram [ECG] [EKG]: Secondary | ICD-10-CM | POA: Insufficient documentation

## 2017-09-10 DIAGNOSIS — Z9884 Bariatric surgery status: Secondary | ICD-10-CM | POA: Diagnosis not present

## 2017-09-10 DIAGNOSIS — Z6841 Body Mass Index (BMI) 40.0 and over, adult: Secondary | ICD-10-CM | POA: Insufficient documentation

## 2017-09-10 DIAGNOSIS — Z01818 Encounter for other preprocedural examination: Secondary | ICD-10-CM | POA: Diagnosis not present

## 2017-09-12 ENCOUNTER — Other Ambulatory Visit: Payer: Self-pay | Admitting: Family Medicine

## 2017-09-15 ENCOUNTER — Ambulatory Visit (INDEPENDENT_AMBULATORY_CARE_PROVIDER_SITE_OTHER): Payer: 59 | Admitting: Neurology

## 2017-09-15 ENCOUNTER — Encounter: Payer: Self-pay | Admitting: Neurology

## 2017-09-15 VITALS — BP 119/78 | HR 73 | Ht 62.0 in | Wt 241.0 lb

## 2017-09-15 DIAGNOSIS — G479 Sleep disorder, unspecified: Secondary | ICD-10-CM

## 2017-09-15 DIAGNOSIS — R351 Nocturia: Secondary | ICD-10-CM

## 2017-09-15 DIAGNOSIS — R0683 Snoring: Secondary | ICD-10-CM | POA: Diagnosis not present

## 2017-09-15 DIAGNOSIS — Z6841 Body Mass Index (BMI) 40.0 and over, adult: Secondary | ICD-10-CM | POA: Diagnosis not present

## 2017-09-15 NOTE — Patient Instructions (Signed)
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
## 2017-09-16 ENCOUNTER — Other Ambulatory Visit: Payer: Self-pay | Admitting: Family Medicine

## 2017-09-18 ENCOUNTER — Encounter: Payer: Self-pay | Admitting: Family Medicine

## 2017-09-18 DIAGNOSIS — F509 Eating disorder, unspecified: Secondary | ICD-10-CM | POA: Diagnosis not present

## 2017-09-19 ENCOUNTER — Encounter: Payer: Self-pay | Admitting: Family Medicine

## 2017-09-19 ENCOUNTER — Telehealth: Payer: Self-pay

## 2017-09-19 NOTE — Telephone Encounter (Signed)
rx was done on 9 28 18  with 5 refills, my chart msg sent to pt

## 2017-09-24 NOTE — Telephone Encounter (Signed)
Med orders faxed

## 2017-09-29 ENCOUNTER — Encounter: Payer: 59 | Attending: General Surgery | Admitting: Registered"

## 2017-09-29 DIAGNOSIS — F419 Anxiety disorder, unspecified: Secondary | ICD-10-CM | POA: Diagnosis not present

## 2017-09-29 DIAGNOSIS — E669 Obesity, unspecified: Secondary | ICD-10-CM | POA: Diagnosis not present

## 2017-09-29 DIAGNOSIS — Z6841 Body Mass Index (BMI) 40.0 and over, adult: Secondary | ICD-10-CM | POA: Diagnosis not present

## 2017-09-29 DIAGNOSIS — Z713 Dietary counseling and surveillance: Secondary | ICD-10-CM | POA: Diagnosis not present

## 2017-09-29 DIAGNOSIS — F329 Major depressive disorder, single episode, unspecified: Secondary | ICD-10-CM | POA: Diagnosis not present

## 2017-09-29 NOTE — Progress Notes (Signed)
  Pre-Operative Nutrition Class:  Appt start time: 8:15   End time:  9:15  Patient was seen on 09/29/2017 for Pre-Operative Bariatric Surgery Education at [facility].   Surgery date: TBD Surgery type: Sleeve gastrectomy Start weight at [facility]: 239.0 Weight today: 241.4   Samples given per MNT protocol. Patient educated on appropriate usage: Bariatric Advantage Multivitamin Lot # [id number] Exp: 06/2018  Bariatric Advantage Calcium Citrate (peanut butter chocolate) Lot # [id number] Exp: 10/17/2017  Bariatric Advantage Calcium Citrate (lemon) Lot # [id number] Exp: 08/28/2018  Renee Pain Protein Powder (chocolate) Lot # [id number] Exp: 02/2018   Unjury Protein Powder (strawberry) Lot # [id number] Exp: 02/2018   The following the learning objectives were met by the patient during this course:  Identify Pre-Op Dietary Goals and will begin 2 weeks pre-operatively  Identify appropriate sources of fluids and proteins   State protein recommendations and appropriate sources pre and post-operatively  Identify Post-Operative Dietary Goals and will follow for 2 weeks post-operatively  Identify appropriate multivitamin and calcium sources  Describe the need for physical activity post-operatively and will follow MD recommendations  State when to call healthcare provider regarding medication questions or post-operative complications  Handouts given during class include:  Pre-Op Bariatric Surgery Diet Handout  Protein Shake Handout  Post-Op Bariatric Surgery Nutrition Handout  BELT Program Information Flyer  Support Group Information Flyer  [facility] Bariatric Supplements Price List  Follow-Up Plan: Patient will follow-up at [facility] 2 weeks post operatively for diet advancement per MD.

## 2017-10-14 ENCOUNTER — Ambulatory Visit (INDEPENDENT_AMBULATORY_CARE_PROVIDER_SITE_OTHER): Payer: 59 | Admitting: Neurology

## 2017-10-14 DIAGNOSIS — R351 Nocturia: Secondary | ICD-10-CM

## 2017-10-14 DIAGNOSIS — G4720 Circadian rhythm sleep disorder, unspecified type: Secondary | ICD-10-CM

## 2017-10-14 DIAGNOSIS — G4733 Obstructive sleep apnea (adult) (pediatric): Secondary | ICD-10-CM

## 2017-10-14 DIAGNOSIS — R0683 Snoring: Secondary | ICD-10-CM

## 2017-10-14 DIAGNOSIS — G479 Sleep disorder, unspecified: Secondary | ICD-10-CM

## 2017-10-14 DIAGNOSIS — Z6841 Body Mass Index (BMI) 40.0 and over, adult: Secondary | ICD-10-CM

## 2017-10-16 NOTE — Procedures (Signed)
PATIENT'S NAME:  [name], [name] DOB:      [birth year]      MR#:    [id number]     DATE OF RECORDING: 10/14/2017 REFERRING M.D.: Dr. Greer Pickerel,      PCP: Blanchie Serve, MD Study Performed:   Baseline Polysomnogram HISTORY: 46 year old woman with a history of depression, anxiety, vitamin D deficiency, smoking, right foot drop, and morbid obesity, who reports snoring and chronic sleep difficulties. Her Epworth sleepiness score is 3 out of 24. The patient's weight 241 pounds with a height of 62 (inches), resulting in a BMI of 44.2 kg/m2. The patient's neck circumference measured 15.1 inches.  CURRENT MEDICATIONS: Calcium-Vitamin D, Sertraline and Zolpidem.   PROCEDURE:  This is a multichannel digital polysomnogram utilizing the Somnostar 11.2 system.  Electrodes and sensors were applied and monitored per AASM Specifications.   EEG, EOG, Chin and Limb EMG, were sampled at 200 Hz.  ECG, Snore and Nasal Pressure, Thermal Airflow, Respiratory Effort, CPAP Flow and Pressure, Oximetry was sampled at 50 Hz. Digital video and audio were recorded.      BASELINE STUDY  Lights Out was at 22:09 and Lights On at 04:57.  Total recording time (TRT) was 408.5 minutes, with a total sleep time (TST) of 368.5 minutes.   The patient's sleep latency was 16.5 minutes, which is normal. REM latency was 129.5 minutes, which is mildly increased. The sleep efficiency was 90.2 %.     SLEEP ARCHITECTURE: WASO (Wake after sleep onset) was 17.5 minutes with mild sleep fragmentation noted. There were 22 minutes in Stage N1, 297 minutes Stage N2, 3 minutes Stage N3 and 46.5 minutes in Stage REM.  The percentage of Stage N1 was 6.%, which is normal, Stage N2 was 80.6%, which is markedly increased, Stage N3 was .8%, which is reduced, and Stage R (REM sleep) was 12.6%, which is reduced. The arousals were noted as: 54 were spontaneous, 1 were associated with PLMs, 54 were associated with respiratory events.    Audio and video analysis  did not show any abnormal or unusual movements, behaviors, phonations or vocalizations. The patient took no bathroom breaks. Mild to moderate snoring was noted. The EKG was in keeping with normal sinus rhythm (NSR).  RESPIRATORY ANALYSIS:  There were a total of 54 respiratory events:  6 obstructive apneas, 0 central apneas and 0 mixed apneas with a total of 6 apneas and an apnea index (AI) of 1. /hour. There were 48 hypopneas with a hypopnea index of 7.8 /hour. The patient also had 0 respiratory event related arousals (RERAs).      The total APNEA/HYPOPNEA INDEX (AHI) was 8.8/hour and the total RESPIRATORY DISTURBANCE INDEX was 8.8 /hour.  23 events occurred in REM sleep and 52 events in NREM. The REM AHI was 29.7 /hour, versus a non-REM AHI of 5.8. The patient spent 210 minutes of total sleep time in the supine position and 159 minutes in non-supine.. The supine AHI was 10.8 versus a non-supine AHI of 6.1.  OXYGEN SATURATION & C02:  The Wake baseline 02 saturation was 95%, with the lowest being 82%. Time spent below 89% saturation equaled 14 minutes.  PERIODIC LIMB MOVEMENTS: The patient had a total of 12 Periodic Limb Movements.  The Periodic Limb Movement (PLM) index was 2. and the PLM Arousal index was .2/hour.  Post-study, the patient indicated that sleep was better than usual.   IMPRESSION:  1. Obstructive Sleep Apnea (OSA) 2. Dysfunctions associated with sleep stages or arousal from  sleep  RECOMMENDATIONS:  1. This study demonstrates overall mild obstructive sleep apnea, moderate in REM sleep with a total AHI of 8.8/hour, REM AHI of 29.7/hour, and O2 nadir of 82%. Given the patient's medical history and sleep related complaints and in light of upcoming elective surgery, a full-night CPAP titration study is recommended to optimize therapy. Other treatment options may include avoidance of supine sleep position along with weight loss, upper airway or jaw surgery in selected patients or the use  of an oral appliance in certain patients. ENT evaluation and/or consultation with a maxillofacial surgeon or dentist may be feasible in some instances.    2. Please note that untreated obstructive sleep apnea carries additional perioperative morbidity. Patients with significant obstructive sleep apnea should receive perioperative PAP therapy and the surgeons and particularly the anesthesiologist should be informed of the diagnosis and the severity of the sleep disordered breathing. 3. This study shows sleep fragmentation and abnormal sleep stage percentages; these are nonspecific findings and per se do not signify an intrinsic sleep disorder or a cause for the patient's sleep-related symptoms. Causes include (but are not limited to) the first night effect of the sleep study, circadian rhythm disturbances, medication effect or an underlying mood disorder or medical problem.  4. The patient should be cautioned not to drive, work at heights, or operate dangerous or heavy equipment when tired or sleepy. Review and reiteration of good sleep hygiene measures should be pursued with any patient. 5. The patient will be seen in follow-up by Dr. Rexene Alberts at [facility] for discussion of the test results and further management strategies. The referring provider will be notified of the test results.  I certify that I have reviewed the entire raw data recording prior to the issuance of this report in accordance with the Standards of Accreditation of the American Academy of Sleep Medicine (AASM)  Star Age, MD, PhD Diplomat, American Board of Psychiatry and Neurology (Neurology and Sleep Medicine)

## 2017-10-16 NOTE — Addendum Note (Signed)
Addended by: Star Age on: 10/16/2017 06:06 PM   Modules accepted: Orders

## 2017-10-16 NOTE — Progress Notes (Signed)
Patient referred by Dr. Redmond Pulling, seen by me on 09/15/17, diagnostic PSG on 10/14/17.    Please call and notify the patient that the recent sleep study did confirm the diagnosis of overall mild obstructive sleep apnea, moderate in REM sleep with a total AHI of 8.8/hour, REM AHI of 29.7/hour, and O2 nadir of 82%. Given the patient's medical history and sleep related complaints and in light of upcoming elective surgery (bariatric), a full-night CPAP titration study is recommended to optimize therapy. This will require a repeat sleep study for proper titration and mask fitting. Please explain to patient and arrange for a CPAP titration study. I have placed an order in the chart. Thanks, and please route to [facility] for scheduling next sleep study.  Star Age, MD, PhD [facility])

## 2017-10-17 ENCOUNTER — Telehealth: Payer: Self-pay | Admitting: Neurology

## 2017-10-17 NOTE — Telephone Encounter (Signed)
Pt has returned the call to RN Myriam Jacobson, please call back

## 2017-10-17 NOTE — Telephone Encounter (Signed)
Called the patient back. No answer LVM for pt to call back.

## 2017-10-17 NOTE — Telephone Encounter (Signed)
-----   Message from Star Age, MD sent at 10/16/2017  6:05 PM EDT ----- Patient referred by Dr. Redmond Pulling, seen by me on 09/15/17, diagnostic PSG on 10/14/17.    Please call and notify the patient that the recent sleep study did confirm the diagnosis of overall mild obstructive sleep apnea, moderate in REM sleep with a total AHI of 8.8/hour, REM AHI of 29.7/hour, and O2 nadir of 82%. Given the patient's medical history and sleep related complaints and in light of upcoming elective surgery (bariatric), a full-night CPAP titration study is recommended to optimize therapy. This will require a repeat sleep study for proper titration and mask fitting. Please explain to patient and arrange for a CPAP titration study. I have placed an order in the chart. Thanks, and please route to [facility] for scheduling next sleep study.  Star Age, MD, PhD [facility])

## 2017-10-17 NOTE — Telephone Encounter (Signed)
I called pt. I advised pt that Dr. Rexene Alberts reviewed their sleep study results and found that pt has OSA. Dr. Rexene Alberts recommends that pt returns to the sleep lab for a CPAP titration study. I have informed the patient on what the study consist of and that the sleep lab will contact the patient once they have insurance approval on the study. Pt verbalized understanding of results. Pt had no questions at this time but was encouraged to call back if questions arise.

## 2017-10-17 NOTE — Telephone Encounter (Signed)
Called patient to discuss the sleep study results. No answer. LVM for the pt to call back

## 2017-10-21 ENCOUNTER — Inpatient Hospital Stay: Admit: 2017-10-21 | Payer: 59 | Admitting: General Surgery

## 2017-10-21 SURGERY — GASTRECTOMY, SLEEVE, LAPAROSCOPIC
Anesthesia: General

## 2017-10-27 ENCOUNTER — Other Ambulatory Visit: Payer: Self-pay | Admitting: *Deleted

## 2017-10-27 NOTE — Patient Outreach (Signed)
[facility]) Care Management  10/27/2017  OREL HORD 1971-11-01 [id number]  Subjective: Telephone call to patient's home  / mobile number, no answer, left HIPAA compliant voicemail message, and requested call back.     Objective: Per KPN (Knowledge Performance Now, point of care tool) and chart review, patient's 10/21/17 surgery was canceled.     Patient has a history of obesity.      Assessment:  Received [facility] Preoperative / Transition of care referral on 09/24/17.  Preoperative call follow up pending patient contact.      Plan: RNCM will call patient for 2nd telephone outreach attempt, preoperative call follow up, within 10 business days if no return call.    Robben Jagiello H. Annia Friendly, BSN, [place] [facility] Telephonic CM Phone: [phone] Fax: [phone]

## 2017-10-28 ENCOUNTER — Other Ambulatory Visit: Payer: Self-pay | Admitting: *Deleted

## 2017-10-28 ENCOUNTER — Ambulatory Visit: Payer: Self-pay | Admitting: *Deleted

## 2017-10-28 ENCOUNTER — Ambulatory Visit (INDEPENDENT_AMBULATORY_CARE_PROVIDER_SITE_OTHER): Payer: 59 | Admitting: Neurology

## 2017-10-28 DIAGNOSIS — Z6841 Body Mass Index (BMI) 40.0 and over, adult: Secondary | ICD-10-CM

## 2017-10-28 DIAGNOSIS — G4720 Circadian rhythm sleep disorder, unspecified type: Secondary | ICD-10-CM

## 2017-10-28 DIAGNOSIS — G4733 Obstructive sleep apnea (adult) (pediatric): Secondary | ICD-10-CM

## 2017-10-28 DIAGNOSIS — R351 Nocturia: Secondary | ICD-10-CM

## 2017-10-28 DIAGNOSIS — G479 Sleep disorder, unspecified: Secondary | ICD-10-CM

## 2017-10-28 DIAGNOSIS — G4761 Periodic limb movement disorder: Secondary | ICD-10-CM

## 2017-10-28 NOTE — Patient Outreach (Signed)
[facility]) Care Management  10/28/2017  GELSEY AMYX 1971/10/26 [id number]   Subjective: Telephone call to patient's home  / mobile number, no answer, left HIPAA compliant voicemail message, and requested call back.     Objective: Per KPN (Knowledge Performance Now, point of care tool) and chart review, patient's 10/21/17 surgery was canceled.     Patient has a history of obesity.      Assessment:  Received [facility] Preoperative / Transition of care referral on 09/24/17.  Preoperative call follow up pending patient contact.      Plan: RNCM will call patient for 3rd telephone outreach attempt, preoperative call follow up, within 10 business days if no return call.    Laquitta Dominski H. Annia Friendly, BSN, [place] [facility] Telephonic CM Phone: [phone] Fax: [phone]

## 2017-10-30 ENCOUNTER — Other Ambulatory Visit: Payer: Self-pay | Admitting: *Deleted

## 2017-10-30 ENCOUNTER — Encounter: Payer: Self-pay | Admitting: *Deleted

## 2017-10-30 ENCOUNTER — Ambulatory Visit: Payer: Self-pay | Admitting: *Deleted

## 2017-10-30 ENCOUNTER — Other Ambulatory Visit: Payer: Self-pay | Admitting: Neurology

## 2017-10-30 ENCOUNTER — Telehealth: Payer: Self-pay

## 2017-10-30 DIAGNOSIS — G4733 Obstructive sleep apnea (adult) (pediatric): Secondary | ICD-10-CM

## 2017-10-30 NOTE — Telephone Encounter (Signed)
-----   Message from Star Age, MD sent at 10/30/2017  1:42 PM EST ----- Patient referred by Dr. Redmond Pulling, seen by me on 09/15/17, diagnostic PSG on 10/14/17, CPAP study on 10/28/17:   I believe, she is scheduled for bariatric surgery in December - I wish her all the best for her surgery.   Please call and inform patient that I have entered an order for treatment with positive airway pressure (PAP) treatment of obstructive sleep apnea (OSA). She did well during the latest sleep study with CPAP. We will, therefore, arrange for a machine for home use through a DME (durable medical equipment) company of Her choice; and I will see the patient back in follow-up in about 10 weeks. Please also explain to the patient that I will be looking out for compliance data, which can be downloaded from the machine (stored on an SD card, that is inserted in the machine) or via remote access through a modem, that is built into the machine. At the time of the followup appointment we will discuss sleep study results and how it is going with PAP treatment at home. Please advise patient to bring Her machine at the time of the first FU visit, even though this is cumbersome. Bringing the machine for every visit after that will likely not be needed, but often helps for the first visit to troubleshoot if needed. Please re-enforce the importance of compliance with treatment and the need for [place] to monitor compliance data - often an insurance requirement and actually good feedback for the patient as far as how they are doing.  Also remind patient, that any interim PAP machine or mask issues should be first addressed with the DME company, as they can often help better with technical and mask fit issues. Please ask if patient has a preference regarding DME company.  Please also make sure, the patient has a follow-up appointment with me in about 10 weeks from the setup date, thanks.  Once you have spoken to the patient - and faxed/routed  report to PCP and referring MD (if other than PCP), you can close this encounter, thanks,   Star Age, MD, PhD [facility] ([place])

## 2017-10-30 NOTE — Progress Notes (Signed)
Patient referred by Dr. Redmond Pulling, seen by me on 09/15/17, diagnostic PSG on 10/14/17, CPAP study on 10/28/17:   I believe, she is scheduled for bariatric surgery in December - I wish her all the best for her surgery.   Please call and inform patient that I have entered an order for treatment with positive airway pressure (PAP) treatment of obstructive sleep apnea (OSA). She did well during the latest sleep study with CPAP. We will, therefore, arrange for a machine for home use through a DME (durable medical equipment) company of Her choice; and I will see the patient back in follow-up in about 10 weeks. Please also explain to the patient that I will be looking out for compliance data, which can be downloaded from the machine (stored on an SD card, that is inserted in the machine) or via remote access through a modem, that is built into the machine. At the time of the followup appointment we will discuss sleep study results and how it is going with PAP treatment at home. Please advise patient to bring Her machine at the time of the first FU visit, even though this is cumbersome. Bringing the machine for every visit after that will likely not be needed, but often helps for the first visit to troubleshoot if needed. Please re-enforce the importance of compliance with treatment and the need for [place] to monitor compliance data - often an insurance requirement and actually good feedback for the patient as far as how they are doing.  Also remind patient, that any interim PAP machine or mask issues should be first addressed with the DME company, as they can often help better with technical and mask fit issues. Please ask if patient has a preference regarding DME company.  Please also make sure, the patient has a follow-up appointment with me in about 10 weeks from the setup date, thanks.  Once you have spoken to the patient - and faxed/routed report to PCP and referring MD (if other than PCP), you can close this  encounter, thanks,   Star Age, MD, PhD [facility] ([place])

## 2017-10-30 NOTE — Patient Outreach (Signed)
[facility]) Care Management  10/30/2017  [name] 05-08-1971 [id number]   Subjective: Telephone call from patient's home / mobile number, spoke with patient, and HIPAA verified.  Discussed [facility] Care Management Preoperative / UMR Transition of care follow up, preoperative call follow up, patient voiced understanding, and is in agreement to both types of follow up.   Patient states she is doing well, ready to get surgery over with, and surgery has been rescheduled for 12/02/17 at [facility]  Patient voices understanding of medical diagnosis, pending surgery, and treatment plan.  States she is accessing the following [facility] benefits: outpatient pharmacy, hospital indemnity, and will call [employer] next week to start family medical leave act (FMLA) process.  Patient states she does not have any preoperative questions, care coordination, disease management, disease monitoring, transportation, community resource, or pharmacy needs at this time.  States she is very appreciative of the follow up and is in agreement to receive [place] Management information post transition of care follow up.    Objective:Per KPN (Knowledge Performance Now, point of care tool) and chart review,patient's 10/21/17 surgery was canceled. Patient has a history of obesity.      Assessment: Received UMR Preoperative / Transition of care referral on 09/24/17. Preoperative call completed, and transition of care follow up pending notification of patient discharge.    Plan:RNCM will call patient for  telephone outreach attempt, transition of care follow up, within 3 business days of hospital discharge notification.     [name] H. Annia Friendly, BSN, [place] Management [facility] Telephonic CM Phone: [phone] Fax: [phone]

## 2017-10-30 NOTE — Patient Outreach (Signed)
[facility]) Care Management  10/30/2017  [name] 04-20-1971 [id number]   Subjective: Received voicemail from Ms. [name], states she is returning call, and requested call back.   Telephone call to patient's home  / mobile number, no answer, left HIPAA compliant voicemail message, and requested call back.   Objective:Per KPN (Knowledge Performance Now, point of care tool) and chart review,patient's 10/21/17 surgery was canceled. Patient has a history of obesity.      Assessment: Received UMR Preoperative / Transition of care referral on 09/24/17. Preoperative call follow up pending patient contact.     Plan:RNCM will send unsuccessful outreach  letter, [facility] pamphlet, and proceed with case closure, within 10 business days if no return call.    [name]. Annia Friendly, BSN, [place] Management [facility] Telephonic CM Phone: [phone] Fax: [phone]

## 2017-10-30 NOTE — Progress Notes (Signed)
This encounter was created in error - please disregard.

## 2017-10-30 NOTE — Procedures (Signed)
PATIENT'S NAME:  [name], [name] DOB:      [birth year]      MR#:    [id number]     DATE OF RECORDING: 10/28/2017 REFERRING M.D.:  Blanchie Serve, MD Study Performed:   CPAP  Titration HISTORY: 46 year old woman with a history of depression, anxiety, vitamin D deficiency, smoking, right foot drop, and morbid obesity, who returns for a CPAP titration following a PSG performed on 10/14/2017, which showed an AHI of 8.8 and low spo2 of 82%. BMI of 44.2 kg/m2. The patient's neck circumference measured 15 inches.  CURRENT MEDICATIONS: Calcium-Vitamin D, Sertraline and Zolpidem.  PROCEDURE:  This is a multichannel digital polysomnogram utilizing the SomnoStar 11.2 system.  Electrodes and sensors were applied and monitored per AASM Specifications.   EEG, EOG, Chin and Limb EMG, were sampled at 200 Hz.  ECG, Snore and Nasal Pressure, Thermal Airflow, Respiratory Effort, CPAP Flow and Pressure, Oximetry was sampled at 50 Hz. Digital video and audio were recorded.      The patient was fitted with a small nasal pillows interface. CPAP was initiated at 5 cmH20 with heated humidity per AASM standards and pressure was advanced to 9 cmH20 because of hypopneas, apneas and desaturations. At a PAP pressure of 9 cmH20, there was a reduction of the AHI to 0/hour with minimal supine REM sleep achieved and O2 nadir of 89%.    Lights Out was at 21:09 and Lights On at 04:49. Total recording time (TRT) was 460 minutes, with a total sleep time (TST) of 407 minutes. The patient's sleep latency was 18 minutes. REM latency was 117 minutes. The sleep efficiency was 88.5 %.    SLEEP ARCHITECTURE: WASO (Wake after sleep onset) was 28.5 minutes with mild to moderate sleep fragmentation noted.  There were 45.5 minutes in Stage N1, 202 minutes Stage N2, 117 minutes Stage N3 and 42.5 minutes in Stage REM.  The percentage of Stage N1 was 11.2%, which is increased, Stage N2 was 49.6%, which is normal, Stage N3 was 28.7%, which is increased,  and Stage R (REM sleep) was 10.4%, which is reduced. The arousals were noted as: 50 were spontaneous, 29 were associated with PLMs, 0 were associated with respiratory events.  Audio and video analysis did not show any abnormal or unusual movements, behaviors, phonations or vocalizations. The patient took no bathroom breaks. The EKG was in keeping with normal sinus rhythm (NSR).  RESPIRATORY ANALYSIS:  There was a total of 3 respiratory events: 0 obstructive apneas, 0 central apneas and 0 mixed apneas with a total of 0 apneas and an apnea index (AI) of 0 /hour. There were 3 hypopneas with a hypopnea index of .4/hour. The patient also had 0 respiratory event related arousals (RERAs).      The total APNEA/HYPOPNEA INDEX  (AHI) was .4 /hour and the total RESPIRATORY DISTURBANCE INDEX was .4 .hour  3 events occurred in REM sleep and 0 events in NREM. The REM AHI was 4.2 /hour versus a non-REM AHI of 0 /hour.  The patient spent 151 minutes of total sleep time in the supine position and 256 minutes in non-supine. The supine AHI was 1.2, versus a non-supine AHI of 0.0.  OXYGEN SATURATION & C02:  The baseline 02 saturation was 98%, with the lowest being 86%. Time spent below 89% saturation equaled 2 minutes.  PERIODIC LIMB MOVEMENTS: The patient had a total of 164 Periodic Limb Movements. The Periodic Limb Movement (PLM) index was 24.2 and the PLM Arousal index was  4.3 /hour.  Post-study, the patient indicated that sleep was the same as usual.   IMPRESSION:   1. Obstructive Sleep Apnea (OSA) 2. PLMD (Periodic Limb Movements of sleep) 3. Dysfunctions associated with sleep stages or arousal from sleep   RECOMMENDATIONS:   1. This study demonstrates resolution of the patient's obstructive sleep apnea with CPAP therapy. I will, therefore, start the patient on home CPAP treatment at a pressure of 9 cm via small nasal pillows with heated humidity. The patient should be reminded to be fully compliant with PAP  therapy to improve sleep related symptoms and decrease long term cardiovascular risks. The patient should be reminded, that it may take up to 3 months to get fully used to using PAP with all planned sleep. The earlier full compliance is achieved, the better long term compliance tends to be. Please note that untreated obstructive sleep apnea carries additional perioperative morbidity. Patients with significant obstructive sleep apnea should receive perioperative PAP therapy and the surgeons and particularly the anesthesiologist should be informed of the diagnosis and the severity of the sleep disordered breathing. 2. Mild PLMs (periodic limb movements of sleep) were noted during this study with minimal arousals; clinical correlation is recommended. Medication effect from the antidepressant medication should be considered.  3. This study shows sleep fragmentation and abnormal sleep stage percentages; these are nonspecific findings and per se do not signify an intrinsic sleep disorder or a cause for the patient's sleep-related symptoms. Causes include (but are not limited to) the first night effect of the sleep study, circadian rhythm disturbances, medication effect or an underlying mood disorder or medical problem.  4. The patient should be cautioned not to drive, work at heights, or operate dangerous or heavy equipment when tired or sleepy. Review and reiteration of good sleep hygiene measures should be pursued with any patient. 5. The patient will be seen in follow-up by Dr. Rexene Alberts at [facility] for discussion of the test results and further management strategies. The referring provider will be notified of the test results.   I certify that I have reviewed the entire raw data recording prior to the issuance of this report in accordance with the Standards of Accreditation of the American Academy of Sleep Medicine (AASM)   Star Age, MD, PhD Diplomat, American Board of Psychiatry and Neurology (Neurology and Sleep  Medicine)

## 2017-10-30 NOTE — Telephone Encounter (Signed)
I called pt. I advised pt that Dr. Rexene Alberts reviewed their sleep study results and found that pt did well with the cpap during her latest sleep study. Dr. Rexene Alberts recommends that pt start a cpap at home. I reviewed PAP compliance expectations with the pt. Pt is agreeable to starting a CPAP. I advised pt that an order will be sent to a DME, [facility], and [facility] will call the pt within about one week after they file with the pt's insurance. [facility] will show the pt how to use the machine, fit for masks, and troubleshoot the CPAP if needed. A follow up appt was made for insurance purposes with Dr. Rexene Alberts on 01/26/18 at 8:30am. Pt verbalized understanding to arrive 15 minutes early and bring their CPAP. A letter with all of this information in it will be mailed to the pt as a reminder. I verified with the pt that the address we have on file is correct. Pt verbalized understanding of results. Pt had no questions at this time but was encouraged to call back if questions arise.

## 2017-11-19 ENCOUNTER — Encounter (HOSPITAL_COMMUNITY): Payer: Self-pay | Admitting: *Deleted

## 2017-11-20 ENCOUNTER — Other Ambulatory Visit: Payer: Self-pay

## 2017-11-20 ENCOUNTER — Encounter (HOSPITAL_COMMUNITY): Payer: Self-pay | Admitting: *Deleted

## 2017-11-20 DIAGNOSIS — G4733 Obstructive sleep apnea (adult) (pediatric): Secondary | ICD-10-CM | POA: Diagnosis not present

## 2017-11-20 NOTE — Progress Notes (Signed)
[name]  11/20/2017   Your procedure is scheduled on: 11-25-17  Report to [facility] Main  Entrance Take [name]  elevators to 3rd floor to  [name] at  915 AM.   Call this number if you have problems the morning of surgery [phone]   Remember: ONLY 1 PERSON MAY GO WITH YOU TO SHORT STAY TO GET  READY MORNING OF [name]  Do not eat food or drink liquids :After Midnight.     Take these medicines the morning of surgery with A SIP OF WATER: none             You may not have any metal on your body including hair pins and              piercings  Do not wear jewelry, make-up, lotions, powders or perfumes, deodorant             Do not wear nail polish.  Do not shave  48 hours prior to surgery.              Men may shave face and neck.   Do not bring valuables to the hospital. [name]  Contacts, dentures or bridgework may not be worn into surgery.  Leave suitcase in the car. After surgery it may be brought to your room.                  Please read over the following fact sheets you were given: _____________________________________________________________________             [facility] - Preparing for Surgery Before surgery, you can play an important role.  Because skin is not sterile, your skin needs to be as free of germs as possible.  You can reduce the number of germs on your skin by washing with CHG (chlorahexidine gluconate) soap before surgery.  CHG is an antiseptic cleaner which kills germs and bonds with the skin to continue killing germs even after washing. Please DO NOT use if you have an allergy to CHG or antibacterial soaps.  If your skin becomes reddened/irritated stop using the CHG and inform your nurse when you arrive at Short Stay. Do not shave (including legs and underarms) for at least 48 hours prior to the first CHG shower.  You may shave your  face/neck. Please follow these instructions carefully:  1.  Shower with CHG Soap the night before surgery and the  morning of Surgery.  2.  If you choose to wash your hair, wash your hair first as usual with your  normal  shampoo.  3.  After you shampoo, rinse your hair and body thoroughly to remove the  shampoo.                           4.  Use CHG as you would any other liquid soap.  You can apply chg directly  to the skin and wash                       Gently with a scrungie or clean washcloth.  5.  Apply the CHG Soap to your body ONLY FROM THE NECK DOWN.   Do not use on face/  open                           Wound or open sores. Avoid contact with eyes, ears mouth and genitals (private parts).                       Wash face,  Genitals (private parts) with your normal soap.             6.  Wash thoroughly, paying special attention to the area where your surgery  will be performed.  7.  Thoroughly rinse your body with warm water from the neck down.  8.  DO NOT shower/wash with your normal soap after using and rinsing off  the CHG Soap.                9.  Pat yourself dry with a clean towel.            10.  Wear clean pajamas.            11.  Place clean sheets on your bed the night of your first shower and do not  sleep with pets. Day of Surgery : Do not apply any lotions/deodorants the morning of surgery.  Please wear clean clothes to the hospital/surgery center.  FAILURE TO FOLLOW THESE INSTRUCTIONS MAY RESULT IN THE CANCELLATION OF YOUR SURGERY PATIENT SIGNATURE_________________________________  NURSE SIGNATURE__________________________________  ________________________________________________________________________   [name]  An incentive spirometer is a tool that can help keep your lungs clear and active. This tool measures how well you are filling your lungs with each breath. Taking long deep breaths may help reverse or decrease the chance of developing breathing  (pulmonary) problems (especially infection) following:  A long period of time when you are unable to move or be active. BEFORE THE PROCEDURE   If the spirometer includes an indicator to show your best effort, your nurse or respiratory therapist will set it to a desired goal.  If possible, sit up straight or lean slightly forward. Try not to slouch.  Hold the incentive spirometer in an upright position. INSTRUCTIONS FOR USE  1. Sit on the edge of your bed if possible, or sit up as far as you can in bed or on a chair. 2. Hold the incentive spirometer in an upright position. 3. Breathe out normally. 4. Place the mouthpiece in your mouth and seal your lips tightly around it. 5. Breathe in slowly and as deeply as possible, raising the piston or the ball toward the top of the column. 6. Hold your breath for 3-5 seconds or for as long as possible. Allow the piston or ball to fall to the bottom of the column. 7. Remove the mouthpiece from your mouth and breathe out normally. 8. Rest for a few seconds and repeat Steps 1 through 7 at least 10 times every 1-2 hours when you are awake. Take your time and take a few normal breaths between deep breaths. 9. The spirometer may include an indicator to show your best effort. Use the indicator as a goal to work toward during each repetition. 10. After each set of 10 deep breaths, practice coughing to be sure your lungs are clear. If you have an incision (the cut made at the time of surgery), support your incision when coughing by placing a pillow or rolled up towels firmly against it. Once you are able to get out of bed, walk around indoors and  cough well. You may stop using the incentive spirometer when instructed by your caregiver.  RISKS AND COMPLICATIONS  Take your time so you do not get dizzy or light-headed.  If you are in pain, you may need to take or ask for pain medication before doing incentive spirometry. It is harder to take a deep breath if you  are having pain. AFTER USE  Rest and breathe slowly and easily.  It can be helpful to keep track of a log of your progress. Your caregiver can provide you with a simple table to help with this. If you are using the spirometer at home, follow these instructions: [name] IF:   You are having difficultly using the spirometer.  You have trouble using the spirometer as often as instructed.  Your pain medication is not giving enough relief while using the spirometer.  You develop fever of 100.5 F (38.1 C) or higher. SEEK IMMEDIATE MEDICAL CARE IF:   You cough up bloody sputum that had not been present before.  You develop fever of 102 F (38.9 C) or greater.  You develop worsening pain at or near the incision site. MAKE SURE YOU:   Understand these instructions.  Will watch your condition.  Will get help right away if you are not doing well or get worse. Document Released: 04/14/2007 Document Revised: 02/24/2012 Document Reviewed: 06/15/2007 [facility] Patient Information 2014 [place], [place].   ________________________________________________________________________

## 2017-11-21 ENCOUNTER — Encounter (HOSPITAL_COMMUNITY)
Admission: RE | Admit: 2017-11-21 | Discharge: 2017-11-21 | Disposition: A | Discharge status: Home / Self Care | Payer: 59 | Source: Ambulatory Visit | Attending: General Surgery | Admitting: General Surgery

## 2017-11-21 DIAGNOSIS — G4733 Obstructive sleep apnea (adult) (pediatric): Secondary | ICD-10-CM | POA: Diagnosis not present

## 2017-11-21 DIAGNOSIS — A048 Other specified bacterial intestinal infections: Secondary | ICD-10-CM | POA: Diagnosis not present

## 2017-11-21 DIAGNOSIS — F329 Major depressive disorder, single episode, unspecified: Secondary | ICD-10-CM | POA: Diagnosis not present

## 2017-11-21 DIAGNOSIS — K219 Gastro-esophageal reflux disease without esophagitis: Secondary | ICD-10-CM | POA: Diagnosis not present

## 2017-11-21 DIAGNOSIS — E559 Vitamin D deficiency, unspecified: Secondary | ICD-10-CM | POA: Diagnosis not present

## 2017-11-21 LAB — CBC WITH DIFFERENTIAL/PLATELET
Basophils Absolute: 0 10*3/uL (ref 0.0–0.1)
Basophils Relative: 0 %
EOS ABS: 0.1 10*3/uL (ref 0.0–0.7)
Eosinophils Relative: 1 %
HEMATOCRIT: 41.3 % (ref 36.0–46.0)
HEMOGLOBIN: 13.8 g/dL (ref 12.0–15.0)
LYMPHS ABS: 3 10*3/uL (ref 0.7–4.0)
LYMPHS PCT: 27 %
MCH: 29.7 pg (ref 26.0–34.0)
MCHC: 33.4 g/dL (ref 30.0–36.0)
MCV: 88.8 fL (ref 78.0–100.0)
MONOS PCT: 6 %
Monocytes Absolute: 0.7 10*3/uL (ref 0.1–1.0)
NEUTROS ABS: 7.4 10*3/uL (ref 1.7–7.7)
NEUTROS PCT: 66 %
Platelets: 362 10*3/uL (ref 150–400)
RBC: 4.65 MIL/uL (ref 3.87–5.11)
RDW: 13.3 % (ref 11.5–15.5)
WBC: 11.3 10*3/uL — ABNORMAL HIGH (ref 4.0–10.5)

## 2017-11-21 LAB — COMPREHENSIVE METABOLIC PANEL
ALK PHOS: 64 U/L (ref 38–126)
ALT: 16 U/L (ref 14–54)
ANION GAP: 9 (ref 5–15)
AST: 26 U/L (ref 15–41)
Albumin: 4.5 g/dL (ref 3.5–5.0)
BILIRUBIN TOTAL: 0.4 mg/dL (ref 0.3–1.2)
BUN: 18 mg/dL (ref 6–20)
CALCIUM: 9.6 mg/dL (ref 8.9–10.3)
CO2: 27 mmol/L (ref 22–32)
CREATININE: 0.76 mg/dL (ref 0.44–1.00)
Chloride: 103 mmol/L (ref 101–111)
Glucose, Bld: 132 mg/dL — ABNORMAL HIGH (ref 65–99)
Potassium: 3.8 mmol/L (ref 3.5–5.1)
Sodium: 139 mmol/L (ref 135–145)
TOTAL PROTEIN: 8.2 g/dL — AB (ref 6.5–8.1)

## 2017-11-21 MED FILL — PANTOPRAZOLE SOD DR 40 MG T: 40 | 30 days supply | Qty: 30 | Fill #0

## 2017-11-21 MED FILL — ONDANSETRON ODT 4 MG TABLET: 4 | 4 days supply | Qty: 15 | Fill #0

## 2017-11-21 MED FILL — oxyCODONE HCL 5 MG/5ML SOLN: 5 | 3 days supply | Qty: 75 | Fill #0

## 2017-11-21 NOTE — Progress Notes (Signed)
09/10/17-ekg-epic, cxr-09/10/17-epic

## 2017-11-21 NOTE — Progress Notes (Signed)
CBC done 11/21/17 faxed via epic to Dr Greer Pickerel.

## 2017-11-22 ENCOUNTER — Ambulatory Visit: Payer: Self-pay | Admitting: General Surgery
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
## 2017-11-24 NOTE — Progress Notes (Signed)
Need orders for surgery on 11/25/17

## 2017-11-25 ENCOUNTER — Other Ambulatory Visit: Payer: Self-pay

## 2017-11-25 ENCOUNTER — Encounter (HOSPITAL_COMMUNITY)
Admission: RE | Disposition: A | Discharge status: Home / Self Care | Payer: Self-pay | Source: Ambulatory Visit | Attending: General Surgery

## 2017-11-25 ENCOUNTER — Inpatient Hospital Stay (HOSPITAL_COMMUNITY): Payer: 59 | Admitting: Anesthesiology

## 2017-11-25 ENCOUNTER — Inpatient Hospital Stay (HOSPITAL_COMMUNITY)
Admission: RE | Admit: 2017-11-25 | Discharge: 2017-11-26 | DRG: 621 | Disposition: A | Discharge status: Home / Self Care | Payer: 59 | Source: Ambulatory Visit | Attending: General Surgery | Admitting: General Surgery

## 2017-11-25 ENCOUNTER — Encounter (HOSPITAL_COMMUNITY): Payer: Self-pay | Admitting: *Deleted

## 2017-11-25 DIAGNOSIS — Z9884 Bariatric surgery status: Secondary | ICD-10-CM

## 2017-11-25 DIAGNOSIS — Z6841 Body Mass Index (BMI) 40.0 and over, adult: Secondary | ICD-10-CM

## 2017-11-25 DIAGNOSIS — F419 Anxiety disorder, unspecified: Secondary | ICD-10-CM | POA: Diagnosis present

## 2017-11-25 DIAGNOSIS — G4733 Obstructive sleep apnea (adult) (pediatric): Secondary | ICD-10-CM | POA: Diagnosis not present

## 2017-11-25 DIAGNOSIS — K219 Gastro-esophageal reflux disease without esophagitis: Secondary | ICD-10-CM | POA: Diagnosis not present

## 2017-11-25 DIAGNOSIS — Z87891 Personal history of nicotine dependence: Secondary | ICD-10-CM | POA: Diagnosis not present

## 2017-11-25 DIAGNOSIS — M5430 Sciatica, unspecified side: Secondary | ICD-10-CM | POA: Diagnosis present

## 2017-11-25 DIAGNOSIS — B9681 Helicobacter pylori [H. pylori] as the cause of diseases classified elsewhere: Secondary | ICD-10-CM | POA: Diagnosis present

## 2017-11-25 DIAGNOSIS — G4700 Insomnia, unspecified: Secondary | ICD-10-CM | POA: Diagnosis present

## 2017-11-25 DIAGNOSIS — F329 Major depressive disorder, single episode, unspecified: Secondary | ICD-10-CM | POA: Diagnosis present

## 2017-11-25 DIAGNOSIS — F418 Other specified anxiety disorders: Secondary | ICD-10-CM | POA: Diagnosis not present

## 2017-11-25 DIAGNOSIS — E559 Vitamin D deficiency, unspecified: Secondary | ICD-10-CM | POA: Diagnosis not present

## 2017-11-25 HISTORY — DX: Sleep apnea, unspecified: G47.30

## 2017-11-25 HISTORY — PX: LAPAROSCOPIC GASTRIC SLEEVE RESECTION: SHX5895

## 2017-11-25 LAB — PREGNANCY, URINE: Preg Test, Ur: NEGATIVE

## 2017-11-25 LAB — HEMOGLOBIN AND HEMATOCRIT, BLOOD
HEMATOCRIT: 37.9 % (ref 36.0–46.0)
Hemoglobin: 12.6 g/dL (ref 12.0–15.0)

## 2017-11-25 SURGERY — GASTRECTOMY, SLEEVE, LAPAROSCOPIC
Anesthesia: General | Site: Abdomen

## 2017-11-25 MED ORDER — DEXAMETHASONE SODIUM PHOSPHATE 4 MG/ML IJ SOLN: 4.0000 mg | INTRAMUSCULAR | Status: DC

## 2017-11-25 MED ORDER — HYDROMORPHONE HCL 1 MG/ML IJ SOLN
INTRAMUSCULAR | Status: AC
  Filled 2017-11-25: qty 1

## 2017-11-25 MED ORDER — SUGAMMADEX SODIUM 200 MG/2ML IV SOLN
INTRAVENOUS | Status: AC
  Filled 2017-11-25: qty 2

## 2017-11-25 MED ORDER — APREPITANT 40 MG PO CAPS
40.0000 mg | ORAL_CAPSULE | ORAL | Status: AC
  Administered 2017-11-25: 40 mg via ORAL
  Filled 2017-11-25: qty 1

## 2017-11-25 MED ORDER — FENTANYL CITRATE (PF) 100 MCG/2ML IJ SOLN
INTRAMUSCULAR | Status: AC
  Filled 2017-11-25: qty 2

## 2017-11-25 MED ORDER — SODIUM CHLORIDE 0.9 % IJ SOLN
INTRAMUSCULAR | Status: DC | PRN
  Administered 2017-11-25: 50 mL via INTRAVENOUS

## 2017-11-25 MED ORDER — SODIUM CHLORIDE 0.9 % IJ SOLN
INTRAMUSCULAR | Status: AC
  Filled 2017-11-25: qty 50

## 2017-11-25 MED ORDER — ONDANSETRON HCL 4 MG/2ML IJ SOLN
INTRAMUSCULAR | Status: AC
  Filled 2017-11-25: qty 2

## 2017-11-25 MED ORDER — ROCURONIUM BROMIDE 50 MG/5ML IV SOSY
PREFILLED_SYRINGE | INTRAVENOUS | Status: AC
  Filled 2017-11-25: qty 5

## 2017-11-25 MED ORDER — BUPIVACAINE LIPOSOME 1.3 % IJ SUSP
INTRAMUSCULAR | Status: DC | PRN
  Administered 2017-11-25: 20 mL

## 2017-11-25 MED ORDER — ACETAMINOPHEN 500 MG PO TABS
1000.0000 mg | ORAL_TABLET | ORAL | Status: AC
  Administered 2017-11-25: 1000 mg via ORAL
  Filled 2017-11-25: qty 2

## 2017-11-25 MED ORDER — PROPOFOL 10 MG/ML IV BOLUS
INTRAVENOUS | Status: DC | PRN
  Administered 2017-11-25: 200 mg via INTRAVENOUS

## 2017-11-25 MED ORDER — ENOXAPARIN SODIUM 30 MG/0.3ML ~~LOC~~ SOLN
30.0000 mg | Freq: Two times a day (BID) | SUBCUTANEOUS | Status: DC
  Administered 2017-11-25 – 2017-11-26 (×2): 30 mg via SUBCUTANEOUS
  Filled 2017-11-25 (×2): qty 0.3

## 2017-11-25 MED ORDER — SIMETHICONE 80 MG PO CHEW: 80.0000 mg | CHEWABLE_TABLET | Freq: Four times a day (QID) | ORAL | Status: DC | PRN

## 2017-11-25 MED ORDER — PREMIER PROTEIN SHAKE: 2.0000 [oz_av] | ORAL | Status: DC

## 2017-11-25 MED ORDER — OXYCODONE HCL 5 MG/5ML PO SOLN: 5.0000 mg | ORAL | Status: DC | PRN

## 2017-11-25 MED ORDER — ACETAMINOPHEN 160 MG/5ML PO SOLN
650.0000 mg | Freq: Four times a day (QID) | ORAL | Status: DC
  Administered 2017-11-25 – 2017-11-26 (×4): 650 mg via ORAL
  Filled 2017-11-25 (×4): qty 20.3

## 2017-11-25 MED ORDER — PROPOFOL 10 MG/ML IV BOLUS
INTRAVENOUS | Status: AC
  Filled 2017-11-25: qty 20

## 2017-11-25 MED ORDER — SUGAMMADEX SODIUM 500 MG/5ML IV SOLN
INTRAVENOUS | Status: DC | PRN
  Administered 2017-11-25: 200 mg via INTRAVENOUS

## 2017-11-25 MED ORDER — STERILE WATER FOR IRRIGATION IR SOLN
Status: DC | PRN
  Administered 2017-11-25: 2000 mL

## 2017-11-25 MED ORDER — PROMETHAZINE HCL 25 MG/ML IJ SOLN: 12.5000 mg | Freq: Once | INTRAMUSCULAR | Status: DC | PRN

## 2017-11-25 MED ORDER — BUPIVACAINE LIPOSOME 1.3 % IJ SUSP
20.0000 mL | Freq: Once | INTRAMUSCULAR | Status: DC
  Filled 2017-11-25: qty 20

## 2017-11-25 MED ORDER — DEXAMETHASONE SODIUM PHOSPHATE 10 MG/ML IJ SOLN
INTRAMUSCULAR | Status: AC
  Filled 2017-11-25: qty 1

## 2017-11-25 MED ORDER — ROCURONIUM BROMIDE 50 MG/5ML IV SOSY
PREFILLED_SYRINGE | INTRAVENOUS | Status: DC | PRN
  Administered 2017-11-25: 50 mg via INTRAVENOUS
  Administered 2017-11-25: 10 mg via INTRAVENOUS

## 2017-11-25 MED ORDER — STERILE WATER FOR IRRIGATION IR SOLN
Status: DC | PRN
Start: 2017-11-25 — End: 2017-11-25
  Administered 2017-11-25: 5000 mL

## 2017-11-25 MED ORDER — LACTATED RINGERS IV SOLN
INTRAVENOUS | Status: DC
  Administered 2017-11-25 (×2): via INTRAVENOUS

## 2017-11-25 MED ORDER — PROMETHAZINE HCL 25 MG/ML IJ SOLN: 12.5000 mg | Freq: Four times a day (QID) | INTRAMUSCULAR | Status: DC | PRN

## 2017-11-25 MED ORDER — KCL IN DEXTROSE-NACL 20-5-0.45 MEQ/L-%-% IV SOLN
INTRAVENOUS | Status: DC
  Administered 2017-11-25 – 2017-11-26 (×3): via INTRAVENOUS
  Filled 2017-11-25 (×3): qty 1000

## 2017-11-25 MED ORDER — SUCCINYLCHOLINE CHLORIDE 200 MG/10ML IV SOSY
PREFILLED_SYRINGE | INTRAVENOUS | Status: AC
  Filled 2017-11-25: qty 10

## 2017-11-25 MED ORDER — SCOPOLAMINE 1 MG/3DAYS TD PT72
1.0000 | MEDICATED_PATCH | TRANSDERMAL | Status: DC
  Administered 2017-11-25: 1.5 mg via TRANSDERMAL
  Filled 2017-11-25: qty 1

## 2017-11-25 MED ORDER — GABAPENTIN 300 MG PO CAPS
300.0000 mg | ORAL_CAPSULE | ORAL | Status: AC
  Administered 2017-11-25: 300 mg via ORAL
  Filled 2017-11-25: qty 1

## 2017-11-25 MED ORDER — PANTOPRAZOLE SODIUM 40 MG IV SOLR
40.0000 mg | Freq: Every day | INTRAVENOUS | Status: DC
  Administered 2017-11-25: 40 mg via INTRAVENOUS
  Filled 2017-11-25: qty 40

## 2017-11-25 MED ORDER — LIDOCAINE 2% (20 MG/ML) 5 ML SYRINGE
INTRAMUSCULAR | Status: AC
  Filled 2017-11-25: qty 5

## 2017-11-25 MED ORDER — DIPHENHYDRAMINE HCL 50 MG/ML IJ SOLN: 12.5000 mg | Freq: Three times a day (TID) | INTRAMUSCULAR | Status: DC | PRN

## 2017-11-25 MED ORDER — FENTANYL CITRATE (PF) 100 MCG/2ML IJ SOLN
INTRAMUSCULAR | Status: DC | PRN
  Administered 2017-11-25 (×2): 50 ug via INTRAVENOUS

## 2017-11-25 MED ORDER — MIDAZOLAM HCL 2 MG/2ML IJ SOLN
INTRAMUSCULAR | Status: AC
  Filled 2017-11-25: qty 2

## 2017-11-25 MED ORDER — ONDANSETRON HCL 4 MG/2ML IJ SOLN
INTRAMUSCULAR | Status: DC | PRN
  Administered 2017-11-25: 4 mg via INTRAVENOUS

## 2017-11-25 MED ORDER — 0.9 % SODIUM CHLORIDE (POUR BTL) OPTIME
TOPICAL | Status: DC | PRN
  Administered 2017-11-25: 1000 mL

## 2017-11-25 MED ORDER — HYDROMORPHONE HCL 1 MG/ML IJ SOLN
0.2500 mg | INTRAMUSCULAR | Status: DC | PRN
  Administered 2017-11-25 (×2): 0.5 mg via INTRAVENOUS
  Administered 2017-11-25: 0.25 mg via INTRAVENOUS
  Administered 2017-11-25: 0.5 mg via INTRAVENOUS
  Administered 2017-11-25: 0.25 mg via INTRAVENOUS

## 2017-11-25 MED ORDER — CHLORHEXIDINE GLUCONATE 4 % EX LIQD: 60.0000 mL | Freq: Once | CUTANEOUS | Status: DC

## 2017-11-25 MED ORDER — GENTAMICIN SULFATE 40 MG/ML IJ SOLN
INTRAMUSCULAR | Status: AC
  Administered 2017-11-25: 900 mL via INTRAVENOUS
  Filled 2017-11-25: qty 9.25

## 2017-11-25 MED ORDER — DEXAMETHASONE SODIUM PHOSPHATE 10 MG/ML IJ SOLN
INTRAMUSCULAR | Status: DC | PRN
  Administered 2017-11-25: 10 mg via INTRAVENOUS

## 2017-11-25 MED ORDER — ONDANSETRON HCL 4 MG/2ML IJ SOLN
4.0000 mg | Freq: Four times a day (QID) | INTRAMUSCULAR | Status: DC | PRN
Start: 2017-11-25 — End: 2017-11-26

## 2017-11-25 MED ORDER — GABAPENTIN 250 MG/5ML PO SOLN
200.0000 mg | Freq: Two times a day (BID) | ORAL | Status: DC
  Administered 2017-11-25 – 2017-11-26 (×2): 200 mg via ORAL
  Filled 2017-11-25 (×2): qty 4

## 2017-11-25 MED ORDER — MIDAZOLAM HCL 2 MG/2ML IJ SOLN
INTRAMUSCULAR | Status: DC | PRN
  Administered 2017-11-25: 2 mg via INTRAVENOUS

## 2017-11-25 MED ORDER — MORPHINE SULFATE (PF) 2 MG/ML IV SOLN
1.0000 mg | INTRAVENOUS | Status: DC | PRN
  Administered 2017-11-25: 1 mg via INTRAVENOUS
  Filled 2017-11-25: qty 1

## 2017-11-25 MED ORDER — HEPARIN SODIUM (PORCINE) 5000 UNIT/ML IJ SOLN
5000.0000 [IU] | INTRAMUSCULAR | Status: AC
  Administered 2017-11-25: 5000 [IU] via SUBCUTANEOUS
  Filled 2017-11-25: qty 1

## 2017-11-25 MED ORDER — LIDOCAINE 2% (20 MG/ML) 5 ML SYRINGE
INTRAMUSCULAR | Status: DC | PRN
  Administered 2017-11-25: 100 mg via INTRAVENOUS

## 2017-11-25 MED ORDER — LACTATED RINGERS IR SOLN
Status: DC | PRN
  Administered 2017-11-25: 1000 mL

## 2017-11-25 SURGICAL SUPPLY — 60 items
APPLICATOR COTTON TIP 6IN STRL (MISCELLANEOUS) IMPLANT
APPLIER CLIP ROT 10 11.4 M/L (STAPLE)
APPLIER CLIP ROT 13.4 12 LRG (CLIP)
BANDAGE ADH SHEER 1  50/CT (GAUZE/BANDAGES/DRESSINGS) IMPLANT
BENZOIN TINCTURE PRP APPL 2/3 (GAUZE/BANDAGES/DRESSINGS) ×2 IMPLANT
BLADE SURG SZ11 CARB STEEL (BLADE) ×2 IMPLANT
CABLE HIGH FREQUENCY MONO STRZ (ELECTRODE) ×2 IMPLANT
CHLORAPREP W/TINT 26ML (MISCELLANEOUS) ×4 IMPLANT
CLIP APPLIE ROT 10 11.4 M/L (STAPLE) IMPLANT
CLIP APPLIE ROT 13.4 12 LRG (CLIP) IMPLANT
DECANTER SPIKE VIAL GLASS SM (MISCELLANEOUS) ×2 IMPLANT
DEVICE SUT QUICK LOAD TK 5 (STAPLE) IMPLANT
DEVICE SUT TI-KNOT TK 5X26 (MISCELLANEOUS) IMPLANT
DRAPE UTILITY XL STRL (DRAPES) ×4 IMPLANT
ELECT L-HOOK LAP 45CM DISP (ELECTROSURGICAL)
ELECT PENCIL ROCKER SW 15FT (MISCELLANEOUS) IMPLANT
ELECT REM PT RETURN 15FT ADLT (MISCELLANEOUS) ×2 IMPLANT
ELECTRODE L-HOOK LAP 45CM DISP (ELECTROSURGICAL) IMPLANT
GAUZE SPONGE 2X2 8PLY STRL LF (GAUZE/BANDAGES/DRESSINGS) IMPLANT
GAUZE SPONGE 4X4 12PLY STRL (GAUZE/BANDAGES/DRESSINGS) IMPLANT
GLOVE BIO SURGEON STRL SZ7.5 (GLOVE) ×2 IMPLANT
GLOVE INDICATOR 8.0 STRL GRN (GLOVE) ×2 IMPLANT
GOWN STRL REUS W/TWL XL LVL3 (GOWN DISPOSABLE) ×6 IMPLANT
GRASPER SUT TROCAR 14GX15 (MISCELLANEOUS) ×2 IMPLANT
HOVERMATT SINGLE USE (MISCELLANEOUS) ×2 IMPLANT
KIT BASIN OR (CUSTOM PROCEDURE TRAY) ×2 IMPLANT
MARKER SKIN DUAL TIP RULER LAB (MISCELLANEOUS) ×2 IMPLANT
NEEDLE SPNL 22GX3.5 QUINCKE BK (NEEDLE) ×2 IMPLANT
PACK UNIVERSAL I (CUSTOM PROCEDURE TRAY) ×2 IMPLANT
RELOAD STAPLER BLUE 60MM (STAPLE) ×2 IMPLANT
RELOAD STAPLER GOLD 60MM (STAPLE) ×1 IMPLANT
RELOAD STAPLER GREEN 60MM (STAPLE) ×1 IMPLANT
SCISSORS LAP 5X45 EPIX DISP (ENDOMECHANICALS) IMPLANT
SEALANT SURGICAL APPL DUAL CAN (MISCELLANEOUS) IMPLANT
SET IRRIG TUBING LAPAROSCOPIC (IRRIGATION / IRRIGATOR) ×2 IMPLANT
SHEARS HARMONIC ACE PLUS 45CM (MISCELLANEOUS) ×2 IMPLANT
SLEEVE GASTRECTOMY 40FR VISIGI (MISCELLANEOUS) ×2 IMPLANT
SLEEVE XCEL OPT CAN 5 100 (ENDOMECHANICALS) ×6 IMPLANT
SOLUTION ANTI FOG 6CC (MISCELLANEOUS) ×2 IMPLANT
SPONGE GAUZE 2X2 STER 10/PKG (GAUZE/BANDAGES/DRESSINGS)
SPONGE LAP 18X18 X RAY DECT (DISPOSABLE) ×2 IMPLANT
STAPLER ECHELON BIOABSB 60 FLE (MISCELLANEOUS) ×4 IMPLANT
STAPLER ECHELON LONG 60 440 (INSTRUMENTS) ×2 IMPLANT
STAPLER RELOAD BLUE 60MM (STAPLE) ×4
STAPLER RELOAD GOLD 60MM (STAPLE) ×2
STAPLER RELOAD GREEN 60MM (STAPLE) ×2
STRIP CLOSURE SKIN 1/2X4 (GAUZE/BANDAGES/DRESSINGS) ×2 IMPLANT
SUT MNCRL AB 4-0 PS2 18 (SUTURE) ×2 IMPLANT
SUT SURGIDAC NAB ES-9 0 48 120 (SUTURE) IMPLANT
SUT VICRYL 0 TIES 12 18 (SUTURE) ×2 IMPLANT
SYR 20CC LL (SYRINGE) ×2 IMPLANT
SYR 50ML LL SCALE MARK (SYRINGE) ×2 IMPLANT
TOWEL OR 17X26 10 PK STRL BLUE (TOWEL DISPOSABLE) ×2 IMPLANT
TOWEL OR NON WOVEN STRL DISP B (DISPOSABLE) ×2 IMPLANT
TRAY FOLEY W/METER SILVER 16FR (SET/KITS/TRAYS/PACK) IMPLANT
TROCAR BLADELESS 15MM (ENDOMECHANICALS) ×2 IMPLANT
TROCAR BLADELESS OPT 5 100 (ENDOMECHANICALS) ×2 IMPLANT
TUBING CONNECTING 10 (TUBING) ×4 IMPLANT
TUBING ENDO SMARTCAP (MISCELLANEOUS) ×2 IMPLANT
TUBING INSUF HEATED (TUBING) ×2 IMPLANT

## 2017-11-25 NOTE — Transfer of Care (Signed)
Immediate Anesthesia Transfer of Care Note  Patient: [name]  Procedure(s) Performed: LAPAROSCOPIC GASTRIC SLEEVE RESECTION WITH UPPER ENDO (N/A Abdomen)  Patient Location: PACU  Anesthesia Type:General  Level of Consciousness: awake, alert , oriented and patient cooperative  Airway & Oxygen Therapy: Patient Spontanous Breathing and Patient connected to face mask oxygen  Post-op Assessment: Report given to RN and Post -op Vital signs reviewed and stable  Post vital signs: Reviewed and stable  Last Vitals:  Vitals:   11/25/17 0858  BP: 139/67  Pulse: 96  Resp: 18  Temp: 36.7 C  SpO2: 96%    Last Pain:  Vitals:   11/25/17 0858  TempSrc: Oral      Patients Stated Pain Goal: 5 (51/76/16 0737)  Complications: No apparent anesthesia complications

## 2017-11-25 NOTE — Op Note (Signed)
11/25/2017 [name] [birth year] [id number]   PRE-OPERATIVE DIAGNOSIS:  Active Problems:   Severe obesity (BMI >= 45   Obstructive sleep apnea  POST-OPERATIVE DIAGNOSIS:  same  PROCEDURE:  Procedure(s): LAPAROSCOPIC SLEEVE GASTRECTOMY  UPPER GI ENDOSCOPY  SURGEON:  Surgeon(s): Gayland Curry, MD FACS FASMBS  ASSISTANTS: Alphonsa Overall MD FACS  ANESTHESIA:   general  DRAINS: none   BOUGIE: 40 fr ViSiGi  LOCAL MEDICATIONS USED:   Exparel  EBL: minimal  SPECIMEN:  Source of Specimen:  Greater curvature of stomach  DISPOSITION OF SPECIMEN:  PATHOLOGY  COUNTS:  YES  INDICATION FOR PROCEDURE: This is a very pleasant 46 y.o.-year-old morbidly obese female who has had unsuccessful attempts for sustained weight loss. The patient presents today for a planned laparoscopic sleeve gastrectomy with upper endoscopy. We have discussed the risk and benefits of the procedure extensively preoperatively. Please see my separate notes.  PROCEDURE: After obtaining informed consent and receiving 5000 units of subcutaneous heparin, the patient was brought to the operating room at [facility] and placed supine on the operating room table. General endotracheal anesthesia was established. Sequential compression devices were placed. A orogastric tube was placed. The patient's abdomen was prepped and draped in the usual standard surgical fashion. The patient received preoperative IV antibiotic. A surgical timeout was performed. ERAS protocol used.   Access to the abdomen was achieved using a 5 mm 0 laparoscope thru a 5 mm trocar In the left upper Quadrant 2 fingerbreadths below the left subcostal margin using the Optiview technique. Pneumoperitoneum was smoothly established up to 15 mm of mercury. The laparoscope was advanced and the abdominal cavity was surveilled. The patient was then placed in reverse Trendelenburg.   A 5 mm trocar was placed slightly above and to the left of the umbilicus under  direct visualization.  The [facility] liver retractor was placed under the left lobe of the liver through a 5 mm trocar incision site in the subxiphoid position. A 5 mm trocar was placed in the lateral right upper quadrant along with a 15 mm trocar in the mid right abdomen. A final 5 mm trocar was placed in the lateral LUQ.  All under direct visualization after exparel had been infiltrated in bilateral lateral upper abdominal walls as a TAP block.  The stomach was inspected. It was completely decompressed and the orogastric tube was removed.  There was no anterior dimple that was obviously visible. Her preop UGI showed no hiatal hernia. She had very infrequent reflux.   We identified the pylorus and measured 6 cm proximal to the pylorus and identified an area of where we would start taking down the short gastric vessels. Harmonic scalpel was used to take down the short gastric vessels along the greater curvature of the stomach. We were able to enter the lesser sac. We continued to march along the greater curvature of the stomach taking down the short gastrics. As we approached the gastrosplenic ligament we took care in this area not to injure the spleen. We were able to take down the entire gastrosplenic ligament. We then mobilized the fundus away from the left crus of diaphragm. There were not any significant posterior gastric avascular attachments. This left the stomach completely mobilized. No vessels had been taken down along the lesser curvature of the stomach.  We then reidentified the pylorus. A 40Fr ViSiGi was then placed in the oropharynx and advanced down into the stomach and placed in the distal antrum and positioned along the lesser  curvature. It was placed under suction which secured the 40Fr ViSiGi in place along the lesser curve. Then using the Ethicon echelon 60 mm stapler with a green load with Seamguard, I placed a stapler along the antrum approximately 5 cm from the pylorus. The stapler was  angled so that there is ample room at the angularis incisura. I then fired the first staple load after inspecting it posteriorly to ensure adequate space both anteriorly and posteriorly. At this point I still was not completely past the angularis so with a gold  load with Seamguard, I placed the stapler in position just inside the prior stapleline. We then rotated the stomach to insure that there was adequate anteriorly as well as posteriorly. The stapler was then fired.  At this point I started using 60 mm blue load staple cartridges with Seamguard. The echelon stapler was then repositioned with a 60 mm blue load with Seamguard and we continued to march up along the [place]. My assistant was holding traction along the greater curvature stomach along the cauterized short gastric vessels ensuring that the stomach was symmetrically retracted. Prior to each firing of the staple, we rotated the stomach to ensure that there is adequate stomach left.  As we approached the fundus, I used 60 mm blue cartridge with Seamguard aiming  lateral to the GE junction after mobilizing some of the esophageal fat pad.  The sleeve was inspected. There is no evidence of cork screw. The staple line appeared hemostatic. The CRNA inflated the ViSiGi to the green zone and the upper abdomen was flooded with saline. There were no bubbles. The sleeve was decompressed and the ViSiGi removed. My assistant scrubbed out and performed an upper endoscopy. The sleeve easily distended with air and the scope was easily advanced to the pylorus. There is no evidence of internal bleeding or cork screwing. There was no narrowing at the angularis. There is no evidence of bubbles. Please see his operative note for further details. The gastric sleeve was decompressed and the endoscope was removed.  The greater curvature the stomach was grasped with a laparoscopic grasper and removed from the 15 mm trocar site.  The liver retractor was removed. I then closed the  15 mm trocar site with 1 interrupted 0 Vicryl sutures through the fascia using the endoclose. The closure was viewed laparoscopically and it was airtight. Remaining Exparel was then infiltrated in the preperitoneal spaces around the trocar sites. Pneumoperitoneum was released. All trocar sites were closed with a 4-0 Monocryl in a subcuticular fashion followed by the application of benzoin, steri-strips, and bandaids. The patient was extubated and taken to the recovery room in stable condition. All needle, instrument, and sponge counts were correct x2. There are no immediate complications  (1) 60 mm green with Seamguard (1) 60 mm gold with seamguard (2) 60 mm blue with  seamguard  PLAN OF CARE: Admit to inpatient   PATIENT DISPOSITION:  PACU - hemodynamically stable.   Delay start of Pharmacological VTE agent (>24hrs) due to surgical blood loss or risk of bleeding:  no  Leighton Ruff. Redmond Pulling, MD, FACS FASMBS General, Bariatric, & Minimally Invasive Surgery [facility], [place]

## 2017-11-25 NOTE — Discharge Instructions (Signed)
° ° ° °GASTRIC BYPASS/SLEEVE ° Home Care Instructions ° ° These instructions are to help you care for yourself when you go home. ° °Call: If you have any problems. °• Call [phone] and ask for the surgeon on call °• If you need immediate help, come to the ER at [facility].  °• Tell the ER staff that you are a new post-op gastric bypass or gastric sleeve patient °  °Signs and symptoms to report: • Severe vomiting or nausea °o If you cannot keep down clear liquids for longer than 1 day, call your surgeon  °• Abdominal pain that does not get better after taking your pain medication °• Fever over 100.4° F with chills °• Heart beating over 100 beats a minute °• Shortness of breath at rest °• Chest pain °•  Redness, swelling, drainage, or foul odor at incision (surgical) sites °•  If your incisions open or pull apart °• Swelling or pain in calf (lower leg) °• Diarrhea (Loose bowel movements that happen often), frequent watery, uncontrolled bowel movements °• Constipation, (no bowel movements for 3 days) if this happens: Pick one °o Milk of Magnesia, 2 tablespoons by mouth, 3 times a day for 2 days if needed °o Stop taking Milk of Magnesia once you have a bowel movement °o Call your doctor if constipation continues °Or °o Miralax  (instead of Milk of Magnesia) following the label instructions °o Stop taking Miralax once you have a bowel movement °o Call your doctor if constipation continues °• Anything you think is not normal °  °Normal side effects after surgery: • Unable to sleep at night or unable to focus °• Irritability or moody °• Being tearful (crying) or depressed °These are common complaints, possibly related to your anesthesia medications that put you to sleep, stress of surgery, and change in lifestyle.  This usually goes away a few weeks after surgery.  If these feelings continue, call your primary care doctor. °  °Wound Care: You may have surgical glue, steri-strips, or staples over your incisions after  surgery °• Surgical glue:  Looks like a clear film over your incisions and will wear off a little at a time °• Steri-strips: Strips of tape over your incisions. You may notice a yellowish color on the skin under the steri-strips. This is used to make the   steri-strips stick better. Do not pull the steri-strips off - let them fall off °• Staples: Staples may be removed before you leave the hospital °o If you go home with staples, call [facility], [phone] at for an appointment with your surgeon’s nurse to have staples removed 10 days after surgery. °• Showering: You may shower two (2) days after your surgery unless your surgeon tells you differently °o Wash gently around incisions with warm soapy water, rinse well, and gently pat dry  °o No tub baths until staples are removed, steri-strips fall off or glue is gone.  °  °Medications: • Medications should be liquid or crushed if larger than the size of a dime °• Extended release pills (medication that release a little bit at a time through the day) should NOT be crushed or cut. (examples include XL, ER, DR, SR) °• Depending on the size and number of medications you take, you may need to space (take a few throughout the day)/change the time you take your medications so that you do not over-fill your pouch (smaller stomach) °• Make sure you follow-up with your primary care doctor to   make medication changes needed during rapid weight loss and life-style changes °• If you have diabetes, follow up with the doctor that orders your diabetes medication(s) within one week after surgery and check your blood sugar regularly. °• Do not drive while taking prescription pain medication  °• It is ok to take Tylenol by the bottle instructions with your pain medicine or instead of your pain medicine as needed.  DO NOT TAKE NSAIDS (EXAMPLES OF NSAIDS:  IBUPROFREN/ NAPROXEN)  °Diet:                    First 2 Weeks ° You will see the dietician t about two (2) weeks  after your surgery. The dietician will increase the types of foods you can eat if you are handling liquids well: °• If you have severe vomiting or nausea and cannot keep down clear liquids lasting longer than 1 day, call your surgeon @ ([phone]) °Protein Shake °• Drink at least 2 ounces of shake 5-6 times per day °• Each serving of protein shakes (usually 8 - 12 ounces) should have: °o 15 grams of protein  °o And no more than 5 grams of carbohydrate  °• Goal for protein each day: °o Men = 80 grams per day °o Women = 60 grams per day °• Protein powder may be added to fluids such as non-fat milk or Lactaid milk or unsweetened Soy/Almond milk (limit to 35 grams added protein powder per serving) ° °Hydration °• Slowly increase the amount of water and other clear liquids as tolerated (See Acceptable Fluids) °• Slowly increase the amount of protein shake as tolerated  °•  Sip fluids slowly and throughout the day.  Do not use straws. °• May use sugar substitutes in small amounts (no more than 6 - 8 packets per day; i.e. Splenda) ° °Fluid Goal °• The first goal is to drink at least 8 ounces of protein shake/drink per day (or as directed by the nutritionist); some examples of protein shakes are Syntrax Nectar, Adkins Advantage, EAS Edge HP, and Unjury. See handout from pre-op Bariatric Education Class: °o Slowly increase the amount of protein shake you drink as tolerated °o You may find it easier to slowly sip shakes throughout the day °o It is important to get your proteins in first °• Your fluid goal is to drink 64 - 100 ounces of fluid daily °o It may take a few weeks to build up to this °• 32 oz (or more) should be clear liquids  °And  °• 32 oz (or more) should be full liquids (see below for examples) °• Liquids should not contain sugar, caffeine, or carbonation ° °Clear Liquids: °• Water or Sugar-free flavored water (i.e. Fruit H2O, Propel) °• Decaffeinated coffee or tea (sugar-free) °• Crystal Lite, Wyler’s Lite,  Minute Maid Lite °• Sugar-free Jell-O °• Bouillon or broth °• Sugar-free Popsicle:   *Less than 20 calories each; Limit 1 per day ° °Full Liquids: °Protein Shakes/Drinks + 2 choices per day of other full liquids °• Full liquids must be: °o No More Than 15 grams of Carbs per serving  °o No More Than 3 grams of Fat per serving °• Strained low-fat cream soup (except Cream of Potato or Tomato) °• Non-Fat milk °• Fat-free Lactaid Milk °• Unsweetened Soy Or Unsweetened Almond Milk °• Low Sugar yogurt (Dannon Lite & Fit, Greek yogurt; Oikos Triple Zero; Chobani Simply 100; Yoplait 100 calorie Greek - No Fruit on the Bottom) ° °  °Vitamins   and Minerals • Start 1 day after surgery unless otherwise directed by your surgeon °• 2 Chewable Bariatric Specific Multivitamin / Multimineral Supplement with iron (Example: Bariatric Advantage Multi EA) °• Chewable Calcium with Vitamin D-3 °(Example: 3 Chewable Calcium Plus 600 with Vitamin D-3) °o Take 500 mg three (3) times a day for a total of 1500 mg each day °o Do not take all 3 doses of calcium at one time as it may cause constipation, and you can only absorb 500 mg  at a time  °o Do not mix multivitamins containing iron with calcium supplements; take 2 hours apart °• Menstruating women and those with a history of anemia (a blood disease that causes weakness) may need extra iron °o Talk with your doctor to see if you need more iron °• Do not stop taking or change any vitamins or minerals until you talk to your dietitian or surgeon °• Your Dietitian and/or surgeon must approve all vitamin and mineral supplements °  °Activity and Exercise: Limit your physical activity as instructed by your doctor.  It is important to continue walking at home.  During this time, use these guidelines: °• Do not lift anything greater than ten (10) pounds for at least two (2) weeks °• Do not go back to work or drive until your surgeon says you can °• You may have sex when you feel comfortable  °o It is  VERY important for female patients to use a reliable birth control method; fertility often increases after surgery  °o All hormonal birth control will be ineffective for 30 days after surgery due to medications given during surgery a barrier method must be used. °o Do not get pregnant for at least 18 months °• Start exercising as soon as your doctor tells you that you can °o Make sure your doctor approves any physical activity °• Start with a simple walking program °• Walk 5-15 minutes each day, 7 days per week.  °• Slowly increase until you are walking 30-45 minutes per day °Consider joining our BELT program. [phone] or email [email or web address] °  °Special Instructions Things to remember: °• Use your CPAP when sleeping if this applies to you ° °• [facility] has two free Bariatric Surgery Support Groups that meet monthly °o The 3rd Thursday of each month, 6 pm, [facility]  °o The 2nd Friday of each month, 11:45 am in the private dining room in the basement of [place] °• It is very important to keep all follow up appointments with your surgeon, dietitian, primary care physician, and behavioral health practitioner °• Routine follow up schedule with your surgeon include appointments at 2-3 weeks, 6-8 weeks, 6 months, and 1 year at a minimum.  Your surgeon may request to see you more often.   °o After the first year, please follow up with your bariatric surgeon and dietitian at least once a year in order to maintain best weight loss results °[facility]: [phone] °[facility]: [phone] °Bariatric Nurse Coordinator: [phone] °  °   Reviewed and Endorsed  °by [facility] Patient Education Committee, June, 2016 °Edits Approved: Aug, 2018 ° ° ° °

## 2017-11-25 NOTE — Op Note (Signed)
Name:  [name] MRN: [id number] Date of Surgery: 11/25/2017  Preop Diagnosis:  Morbid Obesity  Postop Diagnosis:  Morbid Obesity (Weight - 245, BMI - 45.5), S/P Gastric Sleeve resection  Procedure:  Upper endoscopy  (Intraoperative)  Surgeon:  Alphonsa Overall, M.D.  Anesthesia:  GET  Indications for procedure: [name] is a 46 y.o. female whose primary care physician is Raylene Everts, MD and has completed a gastric sleeve resection today for weight loss by Dr. Redmond Pulling.  I am doing an intraoperative upper endoscopy to evaluate the gastric pouch after the sleeve gastrectomy.  Operative Note: The patient is under general anesthesia.  Dr. Redmond Pulling is laparoscoping the patient while I do an upper endoscopy to evaluate the stomach pouch.  With the patient intubated, I passed the Pentax upper endoscope without difficulty down the esophagus.  The esophagus was unremarkable.  The esophago-gastric junction was at 38 cm.    The mucosa of the stomach looked viable and the staple line was intact without bleeding.  I advanced the scope to the pylorus, but did not go through it.  While I insufflated the stomach pouch with air, Dr. Redmond Pulling flooded the upper abdomen with saline to put the gastric pouch under saline.  There was no bubbling or evidence of a leak.  There was no evidence of narrowing of the pouch and the gastric sleeve looked tubular.  The scope was then withdrawn.  The esophagus was unremarkable and the patient tolerated the endoscopy without difficulty.  Alphonsa Overall, MD, [facility] Pager: [phone] Office phone:  [phone]

## 2017-11-25 NOTE — Anesthesia Procedure Notes (Signed)
Procedure Name: Intubation Date/Time: 11/25/2017 11:16 AM Performed by: Dione Booze, CRNA Pre-anesthesia Checklist: Patient being monitored, Suction available, Emergency Drugs available and Patient identified Patient Re-evaluated:Patient Re-evaluated prior to induction Oxygen Delivery Method: Circle system utilized Preoxygenation: Pre-oxygenation with 100% oxygen Induction Type: IV induction Ventilation: Mask ventilation without difficulty Laryngoscope Size: Mac and 4 Grade View: Grade I Tube type: Oral Tube size: 7.5 mm Number of attempts: 1 Airway Equipment and Method: Stylet Placement Confirmation: ETT inserted through vocal cords under direct vision,  positive ETCO2 and breath sounds checked- equal and bilateral Secured at: 22 cm Tube secured with: Tape Dental Injury: Teeth and Oropharynx as per pre-operative assessment

## 2017-11-25 NOTE — Progress Notes (Signed)
Pt has declined the use of CPAP QHS.  RT to monitor and assess as needed.  

## 2017-11-25 NOTE — Interval H&P Note (Signed)
History and Physical Interval Note:  11/25/2017 10:34 AM  [name]  has presented today for surgery, with the diagnosis of MORBID OBESITY  The various methods of treatment have been discussed with the patient and family. After consideration of risks, benefits and other options for treatment, the patient has consented to  Procedure(s): LAPAROSCOPIC GASTRIC SLEEVE RESECTION WITH UPPER ENDO (N/A) as a surgical intervention .  The patient's history has been reviewed, patient examined, no change in status, stable for surgery.  I have reviewed the patient's chart and labs.  Questions were answered to the patient's satisfaction.    Leighton Ruff. Redmond Pulling, MD, FACS General, Bariatric, & Minimally Invasive Surgery [facility] [place]   [place]

## 2017-11-25 NOTE — Progress Notes (Signed)
Discussed post op day goals with patient including ambulation, IS, diet progression, pain, and nausea control.  Questions answered. 

## 2017-11-26 ENCOUNTER — Encounter (HOSPITAL_COMMUNITY): Payer: Self-pay | Admitting: General Surgery

## 2017-11-26 LAB — CBC WITH DIFFERENTIAL/PLATELET
Basophils Absolute: 0 10*3/uL (ref 0.0–0.1)
Basophils Relative: 0 %
EOS ABS: 0 10*3/uL (ref 0.0–0.7)
EOS PCT: 0 %
HCT: 39.2 % (ref 36.0–46.0)
Hemoglobin: 12.7 g/dL (ref 12.0–15.0)
LYMPHS ABS: 1.4 10*3/uL (ref 0.7–4.0)
LYMPHS PCT: 11 %
MCH: 29.1 pg (ref 26.0–34.0)
MCHC: 32.4 g/dL (ref 30.0–36.0)
MCV: 89.7 fL (ref 78.0–100.0)
MONO ABS: 0.9 10*3/uL (ref 0.1–1.0)
MONOS PCT: 7 %
Neutro Abs: 10.4 10*3/uL — ABNORMAL HIGH (ref 1.7–7.7)
Neutrophils Relative %: 82 %
PLATELETS: 352 10*3/uL (ref 150–400)
RBC: 4.37 MIL/uL (ref 3.87–5.11)
RDW: 13.3 % (ref 11.5–15.5)
WBC: 12.7 10*3/uL — ABNORMAL HIGH (ref 4.0–10.5)

## 2017-11-26 LAB — COMPREHENSIVE METABOLIC PANEL
ALT: 17 U/L (ref 14–54)
ANION GAP: 8 (ref 5–15)
AST: 27 U/L (ref 15–41)
Albumin: 4 g/dL (ref 3.5–5.0)
Alkaline Phosphatase: 54 U/L (ref 38–126)
BUN: 8 mg/dL (ref 6–20)
CHLORIDE: 107 mmol/L (ref 101–111)
CO2: 27 mmol/L (ref 22–32)
CREATININE: 0.67 mg/dL (ref 0.44–1.00)
Calcium: 9.2 mg/dL (ref 8.9–10.3)
Glucose, Bld: 112 mg/dL — ABNORMAL HIGH (ref 65–99)
Potassium: 4.1 mmol/L (ref 3.5–5.1)
Sodium: 142 mmol/L (ref 135–145)
Total Bilirubin: 0.1 mg/dL — ABNORMAL LOW (ref 0.3–1.2)
Total Protein: 7.5 g/dL (ref 6.5–8.1)

## 2017-11-26 MED ORDER — OXYCODONE HCL 5 MG/5ML PO SOLN: 5.0000 mg | ORAL | 0 refills | Status: DC | PRN

## 2017-11-26 NOTE — Discharge Summary (Signed)
Physician Discharge Summary  [name] PPI:[id number] DOB: 22-Jun-1971 DOA: 11/25/2017  PCP: Raylene Everts, MD  Admit date: 11/25/2017 Discharge date:  11/26/2017   Recommendations for Outpatient Follow-up:   Follow-up Information    Greer Pickerel, MD. Go on 12/18/2017.   Specialty:  General Surgery Why:  at [address] information: [address] [place] [address] [phone]        Greer Pickerel, MD Follow up.   Specialty:  General Surgery Contact information: [place] [address] [phone]          Discharge Diagnoses:  Active Problems:   Severe obesity (BMI >= 40) (HCC)   Obstructive sleep apnea   Morbid obesity ([place])   Surgical Procedure: Laparoscopic Sleeve Gastrectomy, upper endoscopy  Discharge Condition: Good Disposition: Home  Diet recommendation: Postoperative sleeve gastrectomy diet (liquids only)  Filed Weights   11/25/17 0859  Weight: 110.9 kg (244 lb 9.6 oz)     Hospital Course:  The patient was admitted for a planned laparoscopic sleeve gastrectomy. Please see operative note. Preoperatively the patient was given 5000 units of subcutaneous heparin for DVT prophylaxis. Postoperative prophylactic Lovenox dosing was started on the evening of postoperative day 0. ERAS protocol was used. On the evening of postoperative day 0, the patient was started on water and ice chips. On postoperative day 1 the patient had no fever or tachycardia and was tolerating water in their diet was gradually advanced throughout the day. The patient was ambulating without difficulty. Their vital signs are stable without fever or tachycardia. Their hemoglobin had remained stable. The patient was maintained on their home settings for CPAP therapy. The patient had received discharge instructions and counseling. They were deemed stable for discharge and had met discharge criteria  She had a little gas pain but otherwise doing well  without nausea   BP (!) 151/68 (BP Location: Left Arm)   Pulse (!) 56   Temp 98.1 F (36.7 C) (Oral)   Resp 17   Ht '5\' 2"'$  (1.575 m)   Wt 110.9 kg (244 lb 9.6 oz)   LMP 09/18/2017   SpO2 100%   BMI 44.74 kg/m   Gen: alert, NAD, non-toxic appearing Pupils: equal, no scleral icterus Pulm: Lungs clear to auscultation, symmetric chest rise CV: regular rate and rhythm Abd: soft, min tender, nondistended.  No cellulitis. No incisional hernia Ext: no edema, no calf tenderness Skin: no rash, no jaundice   Discharge Instructions  Discharge Instructions    Ambulate hourly while awake   Complete by:  As directed    Call MD for:  difficulty breathing, headache or visual disturbances   Complete by:  As directed    Call MD for:  persistant dizziness or light-headedness   Complete by:  As directed    Call MD for:  persistant nausea and vomiting   Complete by:  As directed    Call MD for:  redness, tenderness, or signs of infection (pain, swelling, redness, odor or green/yellow discharge around incision site)   Complete by:  As directed    Call MD for:  severe uncontrolled pain   Complete by:  As directed    Call MD for:  temperature >101 F   Complete by:  As directed    Diet bariatric full liquid   Complete by:  As directed    Discharge instructions   Complete by:  As directed    See bariatric discharge instructions  Incentive spirometry   Complete by:  As directed    Perform hourly while awake     Allergies as of 11/26/2017      Reactions   Penicillins Rash   Had rash as a child,  (has recently tolerated Augmentin without problems )  Has patient had a PCN reaction causing immediate rash, facial/tongue/throat swelling, SOB or lightheadedness with hypotension: Yes Has patient had a PCN reaction causing severe rash involving mucus membranes or skin necrosis: No Has patient had a PCN reaction that required hospitalization: No Has patient had a PCN reaction occurring within the  last 10 years: No      Medication List    TAKE these medications   calcium carbonate 750 MG chewable tablet Commonly known as:  TUMS EX Chew 1 tablet by mouth 3 (three) times daily.   multivitamin with minerals Tabs tablet Take 1 tablet by mouth 2 (two) times daily.   oxyCODONE 5 MG/5ML solution Commonly known as:  ROXICODONE Take 5-10 mLs (5-10 mg total) by mouth every 4 (four) hours as needed for moderate pain or severe pain.   sertraline 100 MG tablet Commonly known as:  ZOLOFT TAKE TWO TABLETS BY MOUTH ONCE DAILY FOR MOOD What changed:    how much to take  how to take this  when to take this  additional instructions   zolpidem 10 MG tablet Commonly known as:  AMBIEN TAKE ONE TABLET BY MOUTH AT BEDTIME AS NEEDED FOR SLEEP What changed:    how much to take  how to take this  when to take this  additional instructions      Follow-up Information    Greer Pickerel, MD. Go on 12/18/2017.   Specialty:  General Surgery Why:  at [address] information: [address] [place] [address] [phone]        Greer Pickerel, MD Follow up.   Specialty:  General Surgery Contact information: [place] [address] [phone]            The results of significant diagnostics from this hospitalization (including imaging, microbiology, ancillary and laboratory) are listed below for reference.    Significant Diagnostic Studies: No results found.  Labs: Basic Metabolic Panel: Recent Labs  Lab 11/21/17 1313 11/26/17 0530  NA 139 142  K 3.8 4.1  CL 103 107  CO2 27 27  GLUCOSE 132* 112*  BUN 18 8  CREATININE 0.76 0.67  CALCIUM 9.6 9.2   Liver Function Tests: Recent Labs  Lab 11/21/17 1313 11/26/17 0530  AST 26 27  ALT 16 17  ALKPHOS 64 54  BILITOT 0.4 0.1*  PROT 8.2* 7.5  ALBUMIN 4.5 4.0    CBC: Recent Labs  Lab 11/21/17 1313 11/25/17 1248 11/26/17 0530  WBC 11.3*  --  12.7*  NEUTROABS 7.4  --   10.4*  HGB 13.8 12.6 12.7  HCT 41.3 37.9 39.2  MCV 88.8  --  89.7  PLT 362  --  352    CBG: No results for input(s): GLUCAP in the last 168 hours.  Active Problems:   Severe obesity (BMI >= 40) ([place])   Obstructive sleep apnea   Morbid obesity ([place])   Time coordinating discharge: 10 min  Signed:  Gayland Curry, MD [facility], [place] [phone] 11/26/2017, 9:50 AM

## 2017-11-26 NOTE — Progress Notes (Signed)
Pt was discharged home today. Instructions were reviewed with patient, and questions were answered. Pt was taken to main entrance via wheelchair by NT.  

## 2017-11-26 NOTE — Progress Notes (Signed)
Patient alert and oriented, pain is controlled. Patient is tolerating fluids, advanced to protein shake today, patient is tolerating well.  Reviewed Gastric sleeve discharge instructions with patient and patient is able to articulate understanding.  Provided information on BELT program, Support Group and [facility] outpatient pharmacy. All questions answered, will continue to monitor.  

## 2017-11-26 NOTE — Progress Notes (Signed)
Patient alert and oriented, Post op day 1.  Provided support and encouragement.  Encouraged pulmonary toilet, ambulation and small sips of liquids.  Completed 12 ounces of clear fluids started protein shakes. All questions answered.  Will continue to monitor.

## 2017-11-26 NOTE — Plan of Care (Signed)
Nutrition Education Note  Received consult for diet education per DROP protocol. Education provided by Nurse Bariatric Coordinator, patient with no questions for RD at this time.  Discussed 2 week post op diet with pt. Emphasized that liquids must be non carbonated, non caffeinated, and sugar free. Fluid goals discussed. Pt to follow up with outpatient bariatric RD for further diet progression after 2 weeks. Multivitamins and minerals also reviewed. Teach back method used, pt expressed understanding, expect good compliance.   Diet: First 2 Weeks  You will see the nutritionist about two (2) weeks after your surgery. The nutritionist will increase the types of foods you can eat if you are handling liquids well:  If you have severe vomiting or nausea and cannot handle clear liquids lasting longer than 1 day, call your surgeon  Protein Shake  Drink at least 2 ounces of shake 5-6 times per day  Each serving of protein shakes (usually 8 - 12 ounces) should have a minimum of:  15 grams of protein  And no more than 5 grams of carbohydrate  Goal for protein each day:  Men = 80 grams per day  Women = 60 grams per day  Protein powder may be added to fluids such as non-fat milk or Lactaid milk or Soy milk (limit to 35 grams added protein powder per serving)   Hydration  Slowly increase the amount of water and other clear liquids as tolerated (See Acceptable Fluids)  Slowly increase the amount of protein shake as tolerated  Sip fluids slowly and throughout the day  May use sugar substitutes in small amounts (no more than 6 - 8 packets per day; i.e. Splenda)   Fluid Goal  The first goal is to drink at least 8 ounces of protein shake/drink per day (or as directed by the nutritionist); some examples of protein shakes are Premier Protein, Johnson [employer] Johnson, AMR Corporation, EAS Edge HP, and Unjury. See handout from pre-op Bariatric Education Class:  Slowly increase the amount of protein shake you drink as  tolerated  You may find it easier to slowly sip shakes throughout the day  It is important to get your proteins in first  Your fluid goal is to drink 64 - 100 ounces of fluid daily  It may take a few weeks to build up to this  32 oz (or more) should be clear liquids  And  32 oz (or more) should be full liquids (see below for examples)  Liquids should not contain sugar, caffeine, or carbonation   Clear Liquids:  Water or Sugar-free flavored water (i.e. Fruit H2O, Propel)  Decaffeinated coffee or tea (sugar-free)  Crystal Lite, Wyler?s Lite, Minute Maid Lite  Sugar-free Jell-O  Bouillon or broth  Sugar-free Popsicle: *Less than 20 calories each; Limit 1 per day   Full Liquids:  Protein Shakes/Drinks + 2 choices per day of other full liquids  Full liquids must be:  No More Than 12 grams of Carbs per serving  No More Than 3 grams of Fat per serving  Strained low-fat cream soup  Non-Fat milk  Fat-free Lactaid Milk  Sugar-free yogurt (Dannon Lite & Fit, [place] yogurt, Oikos Zero)   Clayton Bibles, MS, RD, LDN [place] Dietitian Pager: [phone] After Hours Pager: [phone]

## 2017-11-28 ENCOUNTER — Telehealth (HOSPITAL_COMMUNITY): Payer: Self-pay

## 2017-11-28 NOTE — Telephone Encounter (Addendum)
Follow up with bariatric surgical patient to discuss post discharge questions.  No answer at this time voicemail left for patient along with contact information to discuss the following questions.  1.  Are you having any pain not relieved by pain medication?no taking tylenol to help with pain  2.  How much fluid total fluid intake have you had in the last 24/48 hours?  50+ ounces of fluid  3.  How much protein intake have you had in the last 24/48 hours?50 grams of protein  4.  Have you had any trouble making urine?yes   5.  Have you had nausea that has not been relieved by nausea medication?no nausea  6.  Are you ambulating every hour?yes  7.  Are you passing gas or had a BM?BM today  8.  Do you know how to contact [place]? [facility]? NDES?yes  9.  Are you taking your vitamins and calcium without difficulty?no problems  10. Tell me how your incision looks?  Any redness, open incision, or drainage?look ood

## 2017-11-28 NOTE — Anesthesia Postprocedure Evaluation (Signed)
Anesthesia Post Note  Patient: [name]  Procedure(s) Performed: LAPAROSCOPIC GASTRIC SLEEVE RESECTION WITH UPPER ENDO (N/A Abdomen)     Patient location during evaluation: PACU Anesthesia Type: General Level of consciousness: awake and alert Pain management: pain level controlled Vital Signs Assessment: post-procedure vital signs reviewed and stable Respiratory status: spontaneous breathing, nonlabored ventilation, respiratory function stable and patient connected to nasal cannula oxygen Cardiovascular status: blood pressure returned to baseline and stable Postop Assessment: no apparent nausea or vomiting Anesthetic complications: no    Last Vitals:  Vitals:   11/26/17 0600 11/26/17 1112  BP: (!) 151/68 137/65  Pulse: (!) 56 (!) 58  Resp: 17 16  Temp: 36.7 C 36.9 C  SpO2: 100% 96%    Last Pain:  Vitals:   11/26/17 1112  TempSrc: Oral  PainSc:                  Riccardo Dubin

## 2017-11-28 NOTE — Anesthesia Preprocedure Evaluation (Signed)
Anesthesia Evaluation  Patient identified by MRN, date of birth, ID band Patient awake    Reviewed: Allergy & Precautions, H&P , Patient's Chart, lab work & pertinent test results, reviewed documented beta blocker date and time   Airway Mallampati: II  TM Distance: >3 FB Neck ROM: full    Dental no notable dental hx.    Pulmonary former smoker,    Pulmonary exam normal breath sounds clear to auscultation       Cardiovascular  Rhythm:regular Rate:Normal     Neuro/Psych    GI/Hepatic   Endo/Other    Renal/GU      Musculoskeletal   Abdominal   Peds  Hematology   Anesthesia Other Findings   Reproductive/Obstetrics                             Anesthesia Physical Anesthesia Plan  ASA: III  Anesthesia Plan: General   Post-op Pain Management:    Induction: Intravenous  PONV Risk Score and Plan: 2 and Treatment may vary due to age or medical condition, Dexamethasone and Ondansetron  Airway Management Planned: Oral ETT  Additional Equipment:   Intra-op Plan:   Post-operative Plan: Extubation in OR  Informed Consent: I have reviewed the patients History and Physical, chart, labs and discussed the procedure including the risks, benefits and alternatives for the proposed anesthesia with the patient or authorized representative who has indicated his/her understanding and acceptance.   Dental Advisory Given  Plan Discussed with: CRNA and Surgeon  Anesthesia Plan Comments: (  )        Anesthesia Quick Evaluation

## 2017-12-04 ENCOUNTER — Other Ambulatory Visit: Payer: Self-pay | Admitting: *Deleted

## 2017-12-04 NOTE — Patient Outreach (Addendum)
[facility]) Care Management  12/04/2017  [name] 1971/02/14 [id number]   Subjective: Telephone call to patient's home  / mobile number, no answer, left HIPAA compliant voicemail message, and requested call back.   Objective:Per KPN (Knowledge Performance Now, point of care tool) and chart review,patient's 10/21/17 surgery was canceled. Surgery rescheduled, patient hospitalized 11/25/17 - 11/26/17 for morbid obesity.   Status post LAPAROSCOPIC SLEEVE GASTRECTOMY and UPPER GI ENDOSCOPY on 11/25/17.   Patient has a history of obesity.     Assessment: Received UMR Preoperative / Transition of care referral on 09/24/17. Preoperative call completed, and Transition of care follow up pending patient contact.      Plan:RNCM will call patient for 2nd telephone outreach attempt, transition of care follow up, within 10 business days if no return call.      Elmond Poehlman H. Annia Friendly, BSN, [place] Management [facility] Telephonic CM Phone: [phone] Fax: [phone]

## 2017-12-11 ENCOUNTER — Encounter: Payer: 59 | Attending: General Surgery | Admitting: Skilled Nursing Facility1

## 2017-12-11 ENCOUNTER — Other Ambulatory Visit: Payer: Self-pay | Admitting: *Deleted

## 2017-12-11 ENCOUNTER — Ambulatory Visit: Payer: Self-pay | Admitting: *Deleted

## 2017-12-11 DIAGNOSIS — E669 Obesity, unspecified: Secondary | ICD-10-CM | POA: Diagnosis not present

## 2017-12-11 DIAGNOSIS — Z6841 Body Mass Index (BMI) 40.0 and over, adult: Secondary | ICD-10-CM | POA: Diagnosis not present

## 2017-12-11 DIAGNOSIS — Z713 Dietary counseling and surveillance: Secondary | ICD-10-CM | POA: Diagnosis not present

## 2017-12-11 DIAGNOSIS — F329 Major depressive disorder, single episode, unspecified: Secondary | ICD-10-CM | POA: Diagnosis not present

## 2017-12-11 DIAGNOSIS — F419 Anxiety disorder, unspecified: Secondary | ICD-10-CM | POA: Diagnosis not present

## 2017-12-11 NOTE — Patient Outreach (Signed)
[facility]) Care Management  12/11/2017  [name] 01/07/1971 [id number]   Subjective: Telephone call to patient's home  / mobile number, no answer, left HIPAA compliant voicemail message, and requested call back.   Objective:Per KPN (Knowledge Performance Now, point of care tool) and chart review,patient's 10/21/17 surgery was canceled. Surgery rescheduled, patient hospitalized 11/25/17 - 11/26/17 for morbid obesity.   Status post LAPAROSCOPIC SLEEVE GASTRECTOMY and UPPER GI ENDOSCOPY on 11/25/17.   Patient has a history of obesity.     Assessment: Received UMR Preoperative / Transition of care referral on 09/24/17. Preoperative call completed, and Transition of care follow up pending patient contact.      Plan:RNCM will call patient for 3rd telephone outreach attempt, transition of care follow up, within 10 business days if no return call.      Carolynne Schuchard H. Annia Friendly, BSN, [place] Management [facility] Telephonic CM Phone: [phone] Fax: [phone]

## 2017-12-15 ENCOUNTER — Other Ambulatory Visit: Payer: Self-pay | Admitting: *Deleted

## 2017-12-15 ENCOUNTER — Ambulatory Visit: Payer: Self-pay | Admitting: *Deleted

## 2017-12-15 ENCOUNTER — Encounter: Payer: Self-pay | Admitting: *Deleted

## 2017-12-15 ENCOUNTER — Encounter: Payer: Self-pay | Admitting: Skilled Nursing Facility1

## 2017-12-15 NOTE — Patient Outreach (Signed)
[facility]) Care Management  12/15/2017  [name] [birth year] [id number]   Subjective:Telephone call to patient's home / mobile number, no answer, left HIPAA compliant voicemail message, and requested call back.   Objective:Per KPN (Knowledge Performance Now, point of care tool) and chart review,patient's 10/21/17 surgery was canceled. Surgery rescheduled, patient hospitalized 11/25/17 - 11/26/17 for morbid obesity. Status post LAPAROSCOPIC SLEEVE GASTRECTOMYand UPPER GI ENDOSCOPYon 11/25/17.Patient has a history of obesity.     Assessment: Received UMR Preoperative / Transition of care referral on 09/24/17. Preoperative call completed, and Transition of care follow up pending patient contact.     Plan:RNCM will send unsuccessful outreach  letter, [facility] pamphlet, and proceed with case closure, within 10 business days if no return call.      Annice Jolly H. Annia Friendly, BSN, [place] Management [facility] Telephonic CM Phone: [phone] Fax: [phone]

## 2017-12-15 NOTE — Progress Notes (Signed)
Bariatric Class:  Appt start time: 1530 end time:  1630.  2 Week Post-Operative Nutrition Class  Patient was seen on 12/11/2017 for Post-Operative [facility]   Surgery date: 11/25/2017 Surgery type: sleeve Start weight at [facility]: 239 Weight today: 227.2  TANITA  BODY COMP RESULTS  12/11/2017   BMI (kg/m^2) 41.6   Fat Mass (lbs) 115   Fat Free Mass (lbs) 112.2   Total Body Water (lbs) 81.8   The following the learning objectives were met by the patient during this course:  Identifies Phase 3A (Soft, High Proteins) Dietary Goals and will begin from 2 weeks post-operatively to 2 months post-operatively  Identifies appropriate sources of fluids and proteins   States protein recommendations and appropriate sources post-operatively  Identifies the need for appropriate texture modifications, mastication, and bite sizes when consuming solids  Identifies appropriate multivitamin and calcium sources post-operatively  Describes the need for physical activity post-operatively and will follow MD recommendations  States when to call healthcare provider regarding medication questions or post-operative complications  Handouts given during class include:  Phase 3A: Soft, High Protein Diet Handout  Follow-Up Plan: Patient will follow-up at [facility] in 6 weeks for 2 month post-op nutrition visit for diet advancement per MD.

## 2017-12-21 DIAGNOSIS — G4733 Obstructive sleep apnea (adult) (pediatric): Secondary | ICD-10-CM | POA: Diagnosis not present

## 2017-12-30 ENCOUNTER — Other Ambulatory Visit: Payer: Self-pay | Admitting: *Deleted

## 2017-12-30 NOTE — Patient Outreach (Signed)
[facility]) Care Management  12/30/2017  [name] [birth year] [id number]   No response from patient outreach attempts will proceed with case closure.    Objective:Per KPN (Knowledge Performance Now, point of care tool) and chart review,patient's 10/21/17 surgery was canceled. Surgery rescheduled, patient hospitalized 11/25/17 - 11/26/17 for morbid obesity. Status post LAPAROSCOPIC SLEEVE GASTRECTOMYand UPPER GI ENDOSCOPYon 11/25/17.Patient has a history of obesity.     Assessment: Received UMR Preoperative / Transition of care referral on 09/24/17. Preoperative call completed, and Transition of care follow up not completed due to unable to contact patient and will proceed with case closure.      Plan:RNCM will send case closure due to unable to reach request to [name] [place] [facility].      [name] H. Annia Friendly, BSN, [place] Management [facility] Telephonic CM Phone: [phone] Fax: [phone]

## 2018-01-21 ENCOUNTER — Encounter: Payer: 59 | Attending: General Surgery | Admitting: Registered"

## 2018-01-21 ENCOUNTER — Encounter: Payer: Self-pay | Admitting: Registered"

## 2018-01-21 DIAGNOSIS — Z6841 Body Mass Index (BMI) 40.0 and over, adult: Secondary | ICD-10-CM | POA: Diagnosis not present

## 2018-01-21 DIAGNOSIS — F329 Major depressive disorder, single episode, unspecified: Secondary | ICD-10-CM | POA: Insufficient documentation

## 2018-01-21 DIAGNOSIS — Z713 Dietary counseling and surveillance: Secondary | ICD-10-CM | POA: Diagnosis not present

## 2018-01-21 DIAGNOSIS — F419 Anxiety disorder, unspecified: Secondary | ICD-10-CM | POA: Diagnosis not present

## 2018-01-21 DIAGNOSIS — G4733 Obstructive sleep apnea (adult) (pediatric): Secondary | ICD-10-CM | POA: Diagnosis not present

## 2018-01-21 DIAGNOSIS — E669 Obesity, unspecified: Secondary | ICD-10-CM | POA: Diagnosis not present

## 2018-01-21 NOTE — Progress Notes (Signed)
Follow-up visit: 8 Weeks Post-Operative Sleeve gastrectomy Surgery  Medical Nutrition Therapy:  Appt start time: 8:00 end time: 8:35.  Primary concerns today: Post-operative Bariatric Surgery Nutrition Management.  Non scale victories: increased energy, more mobile at work, clothes are fitting differently, sleeping better, improved focus more so at worl  Surgery date: 11/25/2017 Surgery type: sleeve Start weight at [facility]: 239 Weight today: 213.0 lbs Weight change: 14.2 lbs loss from 227.2 (12/11/2017) Total weight lost: 26 lbs Weight loss goal: increased energy, to feel better   TANITA  BODY COMP RESULTS  12/11/2017 01/21/2018   BMI (kg/m^2) 41.6 Pt declined   Fat Mass (lbs) 115    Fat Free Mass (lbs) 112.2    Total Body Water (lbs) 81.8    Pt states she been giving old clothes to [place]. Pt states she knows she is not hitting 64 ounces of fluid a day. Pt states co-workers help hold her accountable with fluid. 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 Pt states she is still trying to fit everything into her day with school work and physical activity. Pt was given [facility] multivitamin Lot # [id number] Exp 06/2019  Preferred Learning Style:   No preference indicated   Learning Readiness:   Ready  Change in progress  24-hr recall: B (AM): Quest protein powder (22g) w/ vanilla shake (17g) Snk (AM): 1/2 c greek yogurt (6-8g)  L (PM): protein pack (eggs, cheese, meat) Snk (PM): none  D (PM): 1/2 c Reyah's chili (7g) Snk (PM): none  Fluid intake: water, protein, Propel; averaging ~40 ounces Estimated total protein intake: 60+ grams  Medications: See list Supplementation: 2 Bariatric Advantage + 3 TUMS  Using straws: no Drinking while eating: no Having you been chewing well: yes Chewing/swallowing difficulties: no Changes in vision: no Changes to mood/headaches: no Hair loss/Changes to skin/Changes to nails: some thinning, no,  growing more Any difficulty focusing or concentrating: improved Sweating: no Dizziness/Lightheaded: no Palpitations: no  Carbonated beverages: no N/V/D/C/GAS: sometimes when eating too fast, no, no, no Abdominal Pain: no Dumping syndrome: no Last Lap-Band fill: N/A  Recent physical activity:  Walking at work  Progress Towards Goal(s):  In progress.  Handouts given during visit include:  Phase 3B: High protein + NS vegetables   Nutritional Diagnosis:  Inadequate fluid intake As related to bariatric surgery post-op recommendations.  As evidenced by pt report of less than 64 ounces daily.    Intervention:  Nutrition education and counseling. Goals:  Follow Phase 3B: High Protein + Non-Starchy Vegetables  Eat 3-6 small meals/snacks, every 3-5 hrs  Increase lean protein foods to meet 60g goal  Increase fluid intake to 64oz +  Avoid drinking 15 minutes before, during and 30 minutes after eating  Aim for >30 min of physical activity daily  - Try [facility] multivitamin sample.  - Aim to reduce weigh-ins from daily to once a week.   Teaching Method Utilized:  Visual Auditory Hands on  Barriers to learning/adherence to lifestyle change: work-life balance  Demonstrated degree of understanding via:  Teach Back   Monitoring/Evaluation:  Dietary intake, exercise, lap band fills, and body weight. Follow up in 2 months for 4 month post-op visit.

## 2018-01-21 NOTE — Patient Instructions (Addendum)
Goals:  Follow Phase 3B: High Protein + Non-Starchy Vegetables  Eat 3-6 small meals/snacks, every 3-5 hrs  Increase lean protein foods to meet 60g goal  Increase fluid intake to 64oz +  Avoid drinking 15 minutes before, during and 30 minutes after eating  Aim for >30 min of physical activity daily  - Try [facility] multivitamin sample.   - Aim to reduce weigh-ins from daily to once a week.

## 2018-01-26 ENCOUNTER — Encounter: Payer: Self-pay | Admitting: Neurology

## 2018-01-26 ENCOUNTER — Ambulatory Visit (INDEPENDENT_AMBULATORY_CARE_PROVIDER_SITE_OTHER): Payer: 59 | Admitting: Neurology

## 2018-01-26 VITALS — BP 111/70 | HR 70 | Ht 62.0 in | Wt 213.0 lb

## 2018-01-26 DIAGNOSIS — G4733 Obstructive sleep apnea (adult) (pediatric): Secondary | ICD-10-CM

## 2018-01-26 DIAGNOSIS — Z9989 Dependence on other enabling machines and devices: Secondary | ICD-10-CM | POA: Diagnosis not present

## 2018-01-26 NOTE — Patient Instructions (Addendum)
Please continue using your CPAP regularly. While your insurance requires that you use CPAP at least 4 hours each night on 70% of the nights, I recommend, that you not skip any nights and use it throughout the night if you can. Getting used to CPAP and staying with the treatment long term does take time and patience and discipline. Untreated obstructive sleep apnea when it is moderate to severe can have an adverse impact on cardiovascular health and raise her risk for heart disease, arrhythmias, hypertension, congestive heart failure, stroke and diabetes. Untreated obstructive sleep apnea causes sleep disruption, nonrestorative sleep, and sleep deprivation. This can have an impact on your day to day functioning and cause daytime sleepiness and impairment of cognitive function, memory loss, mood disturbance, and problems focussing. Using CPAP regularly can improve these symptoms.  You have done a reasonably good job trying CPAP. Congratulations on your weight loss success thus far! Let's reevaluate things in about 6 months.   We may have to make another appt soon, if required by insurance to document your compliance with CPAP.
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
## 2018-02-18 DIAGNOSIS — G4733 Obstructive sleep apnea (adult) (pediatric): Secondary | ICD-10-CM | POA: Diagnosis not present

## 2018-03-02 ENCOUNTER — Encounter: Payer: Self-pay | Admitting: Family Medicine

## 2018-03-02 ENCOUNTER — Other Ambulatory Visit: Payer: Self-pay

## 2018-03-02 ENCOUNTER — Ambulatory Visit: Payer: 59 | Admitting: Family Medicine

## 2018-03-02 VITALS — BP 108/56 | HR 63 | Ht 62.0 in | Wt 200.1 lb

## 2018-03-02 DIAGNOSIS — F418 Other specified anxiety disorders: Secondary | ICD-10-CM | POA: Diagnosis not present

## 2018-03-02 DIAGNOSIS — F5105 Insomnia due to other mental disorder: Secondary | ICD-10-CM | POA: Diagnosis not present

## 2018-03-02 DIAGNOSIS — E559 Vitamin D deficiency, unspecified: Secondary | ICD-10-CM | POA: Diagnosis not present

## 2018-03-02 NOTE — Patient Instructions (Signed)
You are doing great I agree that you will benefit from regular exercise  Return every six months Call sooner for problems

## 2018-03-02 NOTE — Progress Notes (Signed)
Chief Complaint  Patient presents with  . Follow-up  [name] is here for routine follow-up. She has lost over 40 pounds in the last few months after gastric bypass. She is pleased with her improvement.  She is eating well.  Her sleep is improved.  She was found to have sleep apnea.  She is now using CPAP. She is trying to get enough exercise.  She is compliant with her diet.  She is very pleased with her test results. She is continuing to work full duty.  She is working on her bachelor's of nursing RN degree. No new complaints.  No accidents or injuries.  No blood work to follow-up on today. She is having some difficulty swallowing her Zoloft pills.  She wonders if she can take Zoloft 100 mg twice daily.  I told her that this is perfectly fine.  Let me know if she continues to have difficulty.   Patient Active Problem List   Diagnosis Date Noted  . Obstructive sleep apnea 11/25/2017  . Morbid obesity ([place]) 11/25/2017  . GERD without esophagitis 2017/09/10  . Family history of death due to heart problem at 31 years of age or younger 08/15/2016  . Right foot drop 01/23/2015  . 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 Insomnia secondary to depression with anxiety 04/13/2014  . Depression with anxiety 04/13/2014  . Other and unspecified hyperlipidemia 04/13/2014  . Positive test for human papillomavirus (HPV) 12/16/2013  . Vitamin D deficiency 04/14/2013    Outpatient Encounter Medications as of 03/02/2018  Medication Sig  . calcium carbonate (TUMS EX) 750 MG chewable tablet Chew 1 tablet by mouth 3 (three) times daily.  . Multiple Vitamin (MULTIVITAMIN WITH MINERALS) TABS tablet Take 1 tablet by mouth 2 (two) times daily.  . sertraline (ZOLOFT) 100 MG tablet TAKE TWO TABLETS BY MOUTH ONCE DAILY FOR MOOD (Patient taking differently: Take 200 mg by mouth at bedtime. )  . zolpidem (AMBIEN) 10 MG tablet TAKE ONE TABLET BY MOUTH AT BEDTIME AS NEEDED FOR SLEEP (Patient taking differently:  Take 10 mgs by mouth daily at bedtime)   No facility-administered encounter medications on file as of 03/02/2018.     Allergies  Allergen Reactions  . Penicillins Rash    Had rash as a child,  (has recently tolerated Augmentin without problems )  Has patient had a PCN reaction causing immediate rash, facial/tongue/throat swelling, SOB or lightheadedness with hypotension: Yes Has patient had a PCN reaction causing severe rash involving mucus membranes or skin necrosis: No Has patient had a PCN reaction that required hospitalization: No Has patient had a PCN reaction occurring within the last 10 years: No     Review of Systems  Constitutional: Negative for activity change, appetite change and unexpected weight change.       Planned weight loss  HENT: Negative for congestion, dental problem, postnasal drip and rhinorrhea.   Eyes: Negative for redness and visual disturbance.  Respiratory: Negative for cough and shortness of breath.   Cardiovascular: Negative for chest pain, palpitations and leg swelling.  Gastrointestinal: Negative for abdominal pain, constipation and diarrhea.  Genitourinary: Negative for difficulty urinating, frequency and menstrual problem.  Musculoskeletal: Negative for arthralgias and back pain.  Neurological: Negative for dizziness and headaches.  Psychiatric/Behavioral: Negative for dysphoric mood and sleep disturbance. The patient is not nervous/anxious.     BP (!) 108/56   Pulse 63   Ht 5\' 2"  (1.575 m)   Wt 200 lb 1.9 oz (90.8  kg)   SpO2 97%   BMI 36.60 kg/m   Physical Exam  Constitutional: She is oriented to person, place, and time. She appears well-developed and well-nourished.  Smiling happy.  BMI has gone down from 44-36  HENT:  Head: Normocephalic and atraumatic.  Mouth/Throat: Oropharynx is clear and moist.  Eyes: Conjunctivae are normal. Pupils are equal, round, and reactive to light.  Neck: Normal range of motion. Neck supple. No thyromegaly  present.  Cardiovascular: Normal rate, regular rhythm and normal heart sounds.  Pulmonary/Chest: Effort normal and breath sounds normal. No respiratory distress.  Abdominal: Soft. Bowel sounds are normal. There is no tenderness.  Ports well healed.  Bowel sounds active.  Nontender  Musculoskeletal: Normal range of motion. She exhibits no edema.  Lymphadenopathy:    She has no cervical adenopathy.  Neurological: She is alert and oriented to person, place, and time.  Gait normal  Skin: Skin is warm and dry.  Psychiatric: She has a normal mood and affect. Her behavior is normal. Thought content normal.  Nursing note and vitals reviewed.   ASSESSMENT/PLAN:  1. Depression with anxiety Patient is in counseling.  Controlled with Zoloft.  No changes in medication or therapy today.  Let her know that walking daily, exercise, will improve depression as well as insomnia.  2. Morbid obesity, unspecified obesity type ([place]) Good response to surgery.  Planned weight loss  3. Insomnia secondary to depression with anxiety As needed Ambien  4. Vitamin D deficiency By history.  Is on vitamin and calcium replacements.   Patient Instructions  You are doing great I agree that you will benefit from regular exercise  Return every six months Call sooner for problems   Raylene Everts, MD

## 2018-03-04 ENCOUNTER — Ambulatory Visit: Payer: 59 | Admitting: Family Medicine

## 2018-03-25 ENCOUNTER — Encounter: Payer: Self-pay | Admitting: Registered"

## 2018-03-25 ENCOUNTER — Encounter: Payer: 59 | Attending: General Surgery | Admitting: Registered"

## 2018-03-25 DIAGNOSIS — Z6841 Body Mass Index (BMI) 40.0 and over, adult: Secondary | ICD-10-CM | POA: Diagnosis not present

## 2018-03-25 DIAGNOSIS — Z713 Dietary counseling and surveillance: Secondary | ICD-10-CM | POA: Diagnosis not present

## 2018-03-25 DIAGNOSIS — F329 Major depressive disorder, single episode, unspecified: Secondary | ICD-10-CM | POA: Insufficient documentation

## 2018-03-25 DIAGNOSIS — F419 Anxiety disorder, unspecified: Secondary | ICD-10-CM | POA: Diagnosis not present

## 2018-03-25 DIAGNOSIS — E669 Obesity, unspecified: Secondary | ICD-10-CM | POA: Insufficient documentation

## 2018-03-25 NOTE — Progress Notes (Signed)
Follow-up visit: 6 Months Post-Operative Sleeve gastrectomy Surgery  Medical Nutrition Therapy:  Appt start time: 8:00 end time: 8:27.  Primary concerns today: Post-operative Bariatric Surgery Nutrition Management.  Non scale victories: increased energy, more mobile at work, clothes are fitting differently, sleeping better, improved focus more so at work, playing frisbee in yard with grandchild, walking with husband, from size 3XL-L, 20/22-14, can cross legs now  Surgery date: 11/25/2017 Surgery type: sleeve Start weight at [facility]: 239 Weight today: 191.2 lbs Weight change: 21.8 lbs loss from 213 (01/21/2018) Total weight lost: 47.8 lbs Weight loss goal: increased energy, to feel better   TANITA  BODY COMP RESULTS  12/11/2017 01/21/2018 03/25/2018   BMI (kg/m^2) 41.6 Pt declined Pt declined   Fat Mass (lbs) 115     Fat Free Mass (lbs) 112.2     Total Body Water (lbs) 81.8      Pt states she realized her relationship with food was psychotic and now has a better relationship with food. Pt states she feels better, in more control, able to handle a lot of things that are happening in her life right now. Pt states she is buying more colors and prints instead of solid colors; pt loves this. Pt reports her husband has MS and it is getting worse. Pt states he is supoer supportive. 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 Pt states she weighs daily because she just has to. Pt states she likes seeing the numbers change, but is ok if they don't change. Pt is still working on increasing fluid intake, currently averaging about 50 oz/day.   Pt states co-workers help hold her accountable with fluid. 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   Preferred Learning Style:   No preference indicated   Learning Readiness:   Ready  Change in progress  24-hr recall: B (AM): 1/2 premier protein shake (15g)  Snk (AM): pork rinds (8g)  or string cheese (6g) L (PM): 2 oz pork chops (14g), lima beans  Snk (PM): greek yogurt (15g) D (PM): 3 oz salmon (21g), cauliflower  Snk (PM): none  Fluid intake: water, protein, Propel; ~50 ounces Estimated total protein intake: 60+ grams  Medications: See list Supplementation: 2 Bariatric Advantage + 3 TUMS  Using straws: no Drinking while eating: rarely Having you been chewing well: yes Chewing/swallowing difficulties: no Changes in vision: no Changes to mood/headaches: no Hair loss/Changes to skin/Changes to nails: some thinning, no, growing more Any difficulty focusing or concentrating: improved Sweating: no Dizziness/Lightheaded: no Palpitations: no  Carbonated beverages: no N/V/D/C/GAS: no, no, no, yes-takes Senna, no Abdominal Pain: no Dumping syndrome: no Last Lap-Band fill: N/A  Recent physical activity:  Walking with leg weights sometimes, walks steps at home when raining 30-60 min, 4-5 days/week  Progress Towards Goal(s):  In progress.  Handouts given during visit include:  none   Nutritional Diagnosis:  Inadequate fluid intake As related to bariatric surgery post-op recommendations.  As evidenced by pt report of less than 64 ounces daily.    Intervention:  Nutrition education and counseling. Goals: - Continue increase fluid intake, aiming for goal of 64 ounces of fluid a day. Try to add in extra 8 ounces.  - Reduce weighing to 2x/week instead of daily.  - Keep up with the great work!  Teaching Method Utilized:  Visual Auditory Hands on  Barriers to learning/adherence to lifestyle change: work-life balance  Demonstrated degree of understanding via:  Teach Back   Monitoring/Evaluation:  Dietary intake, exercise, lap band fills, and body weight. Follow up in 2 months for 6 month post-op visit.

## 2018-03-25 NOTE — Patient Instructions (Addendum)
-   Continue increase fluid intake, aiming for goal of 64 ounces of fluid a day. Try to add in extra 8 ounces.   - Reduce weighing to 2x/week instead of daily.   - Keep up with the great work!

## 2018-04-13 ENCOUNTER — Other Ambulatory Visit: Payer: Self-pay | Admitting: Internal Medicine

## 2018-04-13 ENCOUNTER — Ambulatory Visit (HOSPITAL_COMMUNITY)
Admission: RE | Admit: 2018-04-13 | Discharge: 2018-04-13 | Disposition: A | Discharge status: Home / Self Care | Payer: 59 | Source: Ambulatory Visit | Attending: Internal Medicine | Admitting: Internal Medicine

## 2018-04-13 DIAGNOSIS — R609 Edema, unspecified: Secondary | ICD-10-CM | POA: Diagnosis not present

## 2018-04-13 DIAGNOSIS — W19XXXD Unspecified fall, subsequent encounter: Secondary | ICD-10-CM

## 2018-04-13 DIAGNOSIS — R600 Localized edema: Secondary | ICD-10-CM | POA: Diagnosis not present

## 2018-04-13 DIAGNOSIS — M7989 Other specified soft tissue disorders: Secondary | ICD-10-CM | POA: Diagnosis not present

## 2018-04-21 ENCOUNTER — Other Ambulatory Visit: Payer: Self-pay | Admitting: Family Medicine

## 2018-04-24 ENCOUNTER — Encounter: Payer: Self-pay | Admitting: Family Medicine

## 2018-04-29 ENCOUNTER — Telehealth: Payer: Self-pay | Admitting: Family Medicine

## 2018-04-29 NOTE — Telephone Encounter (Signed)
Called patient to next appt for next availability per abby for refill requests.

## 2018-04-30 ENCOUNTER — Telehealth: Payer: Self-pay | Admitting: Family Medicine

## 2018-04-30 NOTE — Telephone Encounter (Signed)
Called patient to schedule sooner appt, she lvm returning my call. I called her back no answer, lvm

## 2018-05-22 ENCOUNTER — Encounter: Payer: Self-pay | Admitting: Family Medicine

## 2018-05-25 ENCOUNTER — Ambulatory Visit: Payer: 59 | Admitting: Registered"

## 2018-05-28 ENCOUNTER — Encounter: Payer: Self-pay | Admitting: Family Medicine

## 2018-05-28 ENCOUNTER — Other Ambulatory Visit: Payer: Self-pay

## 2018-05-28 ENCOUNTER — Ambulatory Visit: Payer: 59 | Admitting: Family Medicine

## 2018-05-28 VITALS — BP 114/68 | HR 61 | Temp 97.9°F | Resp 16 | Ht 62.0 in | Wt 188.1 lb

## 2018-05-28 DIAGNOSIS — G4700 Insomnia, unspecified: Secondary | ICD-10-CM

## 2018-05-28 DIAGNOSIS — F418 Other specified anxiety disorders: Secondary | ICD-10-CM | POA: Diagnosis not present

## 2018-05-28 MED ORDER — ZOLPIDEM TARTRATE 10 MG PO TABS: 10.0000 mg | ORAL_TABLET | Freq: Every evening | ORAL | 1 refills | Status: DC | PRN

## 2018-05-28 MED ORDER — SERTRALINE HCL 100 MG PO TABS: 200.0000 mg | ORAL_TABLET | Freq: Every day | ORAL | 1 refills | Status: DC

## 2018-05-28 MED ORDER — BUSPIRONE HCL 5 MG PO TABS: 5.0000 mg | ORAL_TABLET | Freq: Two times a day (BID) | ORAL | 0 refills | Status: DC
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
## 2018-05-29 ENCOUNTER — Encounter: Payer: Self-pay | Admitting: Family Medicine

## 2018-06-11 ENCOUNTER — Encounter: Payer: Self-pay | Admitting: Family Medicine

## 2018-06-11 DIAGNOSIS — Z9884 Bariatric surgery status: Secondary | ICD-10-CM | POA: Diagnosis not present

## 2018-06-11 DIAGNOSIS — G4700 Insomnia, unspecified: Secondary | ICD-10-CM

## 2018-06-12 MED ORDER — ZOLPIDEM TARTRATE 10 MG PO TABS: 10.0000 mg | ORAL_TABLET | Freq: Every evening | ORAL | 1 refills | Status: DC | PRN

## 2018-06-12 MED FILL — ZOLPIDEM TARTRATE 10 MG TAB: 10 | 90 days supply | Qty: 90 | Fill #0

## 2018-06-30 ENCOUNTER — Encounter: Payer: Self-pay | Admitting: Registered"

## 2018-06-30 ENCOUNTER — Encounter: Payer: 59 | Attending: General Surgery | Admitting: Registered"

## 2018-06-30 DIAGNOSIS — E669 Obesity, unspecified: Secondary | ICD-10-CM

## 2018-06-30 DIAGNOSIS — Z713 Dietary counseling and surveillance: Secondary | ICD-10-CM | POA: Diagnosis not present

## 2018-06-30 DIAGNOSIS — Z6841 Body Mass Index (BMI) 40.0 and over, adult: Secondary | ICD-10-CM | POA: Insufficient documentation

## 2018-06-30 NOTE — Patient Instructions (Addendum)
Goals:  Follow Phase 3B: High Protein + All Vegetables  Eat 3-6 small meals/snacks, every 3-5 hrs  Increase lean protein foods to meet 60g goal  Increase fluid intake to 64oz +  Avoid drinking 15 minutes before, during and 30 minutes after eating  Aim for >30 min of physical activity daily  Try bariatric multivitamin capsules

## 2018-06-30 NOTE — Progress Notes (Signed)
Follow-up visit: 7 Months Post-Operative Sleeve gastrectomy Surgery  Medical Nutrition Therapy:  Appt start time: 2:45 end time: 3:22  Primary concerns today: Post-operative Bariatric Surgery Nutrition Management.  Non scale victories: increased energy, more mobile at work, clothes are fitting differently, sleeping better, improved focus more so at work, playing frisbee in yard with grandchild, walking with husband, from size 3XL-L, 20/22-12, can cross legs now, able to run a mile, has started kayaking, feels more positive, taking less naps, does not get as frustrated, has more control of mood  Surgery date: 11/25/2017 Surgery type: sleeve Start weight at [facility]: 239 Weight today: N/A; pt declined Weight change: N/A (due to pt declined weight) lbs loss from 191.2 (03/25/2018) Total weight lost: N/A Weight loss goal: increased energy, to feel better   TANITA  BODY COMP RESULTS  12/11/2017 01/21/2018 03/25/2018 06/30/2018   BMI (kg/m^2) 41.6 Pt declined Pt declined Pt declined   Fat Mass (lbs) 115      Fat Free Mass (lbs) 112.2      Total Body Water (lbs) 81.8       Pt states her husband is becoming more vegetarian and he does the cooking. Pt states she has an issue with fake meat, tofu, etc. Pt states she does not want to know what is in her food when eating meat alternatives. Pt states she vomits when she eats salad and loves seafood. Pt states she has signed up for 5k in Oct 2019.  Pt states she realized her relationship with food was psychotic and now has a better relationship with food. Pt states she feels better, in more control, able to handle a lot of things that are happening in her life right now. Pt states she is buying more colors and prints instead of solid colors; pt loves this. Pt reports her husband has MS and it is getting worse. Pt states he is super supportive. Pt states she weighs daily because she just has to. Pt states she likes seeing the numbers change, but is ok if they  don't change. Pt is still working on increasing fluid intake, currently averaging about 50 oz/day.   Pt states co-workers help hold her accountable with fluid. 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   Preferred Learning Style:   No preference indicated   Learning Readiness:   Ready  Change in progress  24-hr recall: B (AM): egg (6g) or greek yogurt (15g) or 1/2 premier protein shake (15g)  Snk (AM): beef jerky (7g) or string cheese (6g) L (PM): spaghetti with veggie noodles (21g) or popcorn  Snk (PM):  beef jerky (7g) or string cheese (6g) D (PM): 3 oz burger patty (21g) + tomato  Snk (PM): none  Fluid intake: water, protein, [employer], decaf coffee/tea; ~50 ounces Estimated total protein intake: 60+ grams  Medications: See list Supplementation: 2 Bariatric Advantage + 3 TUMS  Using straws: no Drinking while eating: rarely Having you been chewing well: yes Chewing/swallowing difficulties: no Changes in vision: no Changes to mood/headaches: no Hair loss/Changes to skin/Changes to nails: some thinning, no, growing more Any difficulty focusing or concentrating: improved Sweating: no Dizziness/Lightheaded: no Palpitations: no  Carbonated beverages: no N/V/D/C/GAS: no, yes only with raw vegetables, no, yes-takes Miralax, no Abdominal Pain: no Dumping syndrome: no Last Lap-Band fill: N/A  Recent physical activity: Walking, going to gym (cardio and weight lifting), kayaking 90 min, 2x/week  Progress Towards Goal(s):  In progress.  Handouts given during visit include:  Phase V: High Protein + All vegetables   Nutritional Diagnosis:  Inadequate fluid intake As related to bariatric surgery post-op recommendations.  As evidenced by pt report of less than 64 ounces daily.    Intervention:  Nutrition education and counseling. Pt was educated and counseled on the next phase of the bariatric post-op surgery. Pt was in agreement with goals  listed.  Goals:  Follow Phase 3B: High Protein + All Vegetables  Eat 3-6 small meals/snacks, every 3-5 hrs  Increase lean protein foods to meet 60g goal  Increase fluid intake to 64oz +  Avoid drinking 15 minutes before, during and 30 minutes after eating  Aim for >30 min of physical activity daily  Try bariatric multivitamin capsules  Teaching Method Utilized:  Visual Auditory Hands on  Barriers to learning/adherence to lifestyle change: work-life balance  Demonstrated degree of understanding via:  Teach Back   Monitoring/Evaluation:  Dietary intake, exercise, lap band fills, and body weight. Follow up in 2 months for 9 month post-op visit.

## 2018-07-10 ENCOUNTER — Encounter: Payer: Self-pay | Admitting: Family Medicine

## 2018-07-10 ENCOUNTER — Other Ambulatory Visit: Payer: Self-pay

## 2018-07-10 ENCOUNTER — Ambulatory Visit (INDEPENDENT_AMBULATORY_CARE_PROVIDER_SITE_OTHER): Payer: 59 | Admitting: Family Medicine

## 2018-07-10 VITALS — BP 116/80 | HR 74 | Temp 98.2°F | Resp 16 | Ht 62.0 in | Wt 187.0 lb

## 2018-07-10 DIAGNOSIS — F411 Generalized anxiety disorder: Secondary | ICD-10-CM

## 2018-07-10 DIAGNOSIS — G4700 Insomnia, unspecified: Secondary | ICD-10-CM | POA: Diagnosis not present

## 2018-07-10 MED ORDER — BUSPIRONE HCL 7.5 MG PO TABS: 7.5000 mg | ORAL_TABLET | Freq: Two times a day (BID) | ORAL | 1 refills | Status: DC
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
## 2018-07-27 ENCOUNTER — Encounter: Payer: Self-pay | Admitting: Adult Health

## 2018-07-27 ENCOUNTER — Ambulatory Visit: Payer: 59 | Admitting: Adult Health
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
## 2018-08-11 ENCOUNTER — Encounter: Payer: Self-pay | Admitting: Family Medicine

## 2018-09-02 ENCOUNTER — Ambulatory Visit: Payer: 59 | Admitting: Family Medicine

## 2018-09-18 MED FILL — ZOLPIDEM TARTRATE 10 MG TAB: 10 | 90 days supply | Qty: 90 | Fill #1

## 2018-09-28 ENCOUNTER — Ambulatory Visit: Payer: 59 | Admitting: Registered"

## 2018-09-28 DIAGNOSIS — H5213 Myopia, bilateral: Secondary | ICD-10-CM | POA: Diagnosis not present

## 2018-09-28 DIAGNOSIS — H524 Presbyopia: Secondary | ICD-10-CM | POA: Diagnosis not present

## 2018-09-28 DIAGNOSIS — H52221 Regular astigmatism, right eye: Secondary | ICD-10-CM | POA: Diagnosis not present

## 2018-11-10 DIAGNOSIS — L728 Other follicular cysts of the skin and subcutaneous tissue: Secondary | ICD-10-CM | POA: Diagnosis not present

## 2018-11-16 ENCOUNTER — Ambulatory Visit (INDEPENDENT_AMBULATORY_CARE_PROVIDER_SITE_OTHER): Payer: 59 | Admitting: Maternal Newborn

## 2018-11-16 ENCOUNTER — Encounter: Payer: Self-pay | Admitting: Maternal Newborn

## 2018-11-16 ENCOUNTER — Other Ambulatory Visit (HOSPITAL_COMMUNITY)
Admission: RE | Admit: 2018-11-16 | Discharge: 2018-11-16 | Disposition: A | Discharge status: Home / Self Care | Payer: 59 | Source: Ambulatory Visit | Attending: Maternal Newborn | Admitting: Maternal Newborn

## 2018-11-16 VITALS — BP 108/68 | HR 73 | Ht 62.0 in | Wt 194.0 lb

## 2018-11-16 DIAGNOSIS — Z01419 Encounter for gynecological examination (general) (routine) without abnormal findings: Secondary | ICD-10-CM | POA: Diagnosis not present

## 2018-11-16 DIAGNOSIS — Z124 Encounter for screening for malignant neoplasm of cervix: Secondary | ICD-10-CM | POA: Diagnosis not present

## 2018-11-16 DIAGNOSIS — Z1231 Encounter for screening mammogram for malignant neoplasm of breast: Secondary | ICD-10-CM
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
## 2018-11-18 LAB — CYTOLOGY - PAP
Diagnosis: NEGATIVE
HPV (WINDOPATH): NOT DETECTED

## 2019-01-11 MED FILL — GATIFLOXACIN 0.5% EYE DROPS: 0.5 | 8 days supply | Qty: 3 | Fill #0

## 2019-01-11 MED FILL — PREDNISOLONE AC 1% EYE DROP: 1 | 16 days supply | Qty: 5 | Fill #0

## 2019-01-26 DIAGNOSIS — Z13220 Encounter for screening for lipoid disorders: Secondary | ICD-10-CM | POA: Diagnosis not present

## 2019-01-26 DIAGNOSIS — Z0000 Encounter for general adult medical examination without abnormal findings: Secondary | ICD-10-CM | POA: Diagnosis not present

## 2019-01-26 DIAGNOSIS — F5101 Primary insomnia: Secondary | ICD-10-CM | POA: Diagnosis not present

## 2019-01-26 DIAGNOSIS — Z9884 Bariatric surgery status: Secondary | ICD-10-CM | POA: Diagnosis not present

## 2019-01-26 DIAGNOSIS — F419 Anxiety disorder, unspecified: Secondary | ICD-10-CM | POA: Diagnosis not present

## 2019-01-26 DIAGNOSIS — F3341 Major depressive disorder, recurrent, in partial remission: Secondary | ICD-10-CM | POA: Diagnosis not present

## 2019-01-26 DIAGNOSIS — Z1239 Encounter for other screening for malignant neoplasm of breast: Secondary | ICD-10-CM | POA: Diagnosis not present

## 2019-02-11 ENCOUNTER — Other Ambulatory Visit (HOSPITAL_COMMUNITY): Payer: Self-pay | Admitting: Family Medicine

## 2019-02-11 DIAGNOSIS — Z1231 Encounter for screening mammogram for malignant neoplasm of breast: Secondary | ICD-10-CM

## 2019-02-18 DIAGNOSIS — J019 Acute sinusitis, unspecified: Secondary | ICD-10-CM | POA: Diagnosis not present

## 2019-02-18 MED FILL — CEFDINIR 300 MG CAPSULE: 300 | 10 days supply | Qty: 20 | Fill #0

## 2019-02-18 MED FILL — HYDROCODONE-HOMATROPINE SOL: 5-1.5 | 4 days supply | Qty: 120 | Fill #0

## 2019-02-18 MED FILL — predniSONE 20 MG TABS: 20 | 6 days supply | Qty: 18 | Fill #0

## 2019-04-29 DIAGNOSIS — M545 Low back pain: Secondary | ICD-10-CM | POA: Diagnosis not present

## 2019-04-30 DIAGNOSIS — Z03818 Encounter for observation for suspected exposure to other biological agents ruled out: Secondary | ICD-10-CM | POA: Diagnosis not present

## 2019-05-03 ENCOUNTER — Ambulatory Visit (HOSPITAL_COMMUNITY)
Admission: RE | Admit: 2019-05-03 | Discharge: 2019-05-03 | Disposition: A | Discharge status: Home / Self Care | Payer: 59 | Source: Ambulatory Visit | Attending: Family Medicine | Admitting: Family Medicine

## 2019-05-03 ENCOUNTER — Other Ambulatory Visit: Payer: Self-pay

## 2019-05-03 DIAGNOSIS — Z1231 Encounter for screening mammogram for malignant neoplasm of breast: Secondary | ICD-10-CM | POA: Diagnosis not present

## 2019-05-03 DIAGNOSIS — M79674 Pain in right toe(s): Secondary | ICD-10-CM | POA: Diagnosis not present

## 2019-05-03 DIAGNOSIS — S92421A Displaced fracture of distal phalanx of right great toe, initial encounter for closed fracture: Secondary | ICD-10-CM | POA: Diagnosis not present

## 2019-06-10 ENCOUNTER — Ambulatory Visit: Payer: 59 | Admitting: Orthopaedic Surgery

## 2019-06-10 ENCOUNTER — Ambulatory Visit (INDEPENDENT_AMBULATORY_CARE_PROVIDER_SITE_OTHER): Payer: 59

## 2019-06-10 ENCOUNTER — Encounter: Payer: Self-pay | Admitting: Orthopaedic Surgery

## 2019-06-10 ENCOUNTER — Other Ambulatory Visit: Payer: Self-pay

## 2019-06-10 VITALS — BP 129/81 | HR 76 | Temp 97.7°F | Ht 62.0 in | Wt 212.0 lb

## 2019-06-10 DIAGNOSIS — M25571 Pain in right ankle and joints of right foot: Secondary | ICD-10-CM | POA: Diagnosis not present

## 2019-06-10 DIAGNOSIS — M21371 Foot drop, right foot: Secondary | ICD-10-CM | POA: Diagnosis not present
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
## 2019-07-20 DIAGNOSIS — Z20828 Contact with and (suspected) exposure to other viral communicable diseases: Secondary | ICD-10-CM | POA: Diagnosis not present

## 2019-07-27 DIAGNOSIS — F419 Anxiety disorder, unspecified: Secondary | ICD-10-CM | POA: Diagnosis not present

## 2019-07-27 DIAGNOSIS — F5101 Primary insomnia: Secondary | ICD-10-CM | POA: Diagnosis not present

## 2019-07-27 DIAGNOSIS — F3341 Major depressive disorder, recurrent, in partial remission: Secondary | ICD-10-CM | POA: Diagnosis not present

## 2019-08-02 ENCOUNTER — Encounter (HOSPITAL_COMMUNITY): Payer: Self-pay

## 2020-02-01 DIAGNOSIS — Z13220 Encounter for screening for lipoid disorders: Secondary | ICD-10-CM | POA: Diagnosis not present

## 2020-02-01 DIAGNOSIS — G44209 Tension-type headache, unspecified, not intractable: Secondary | ICD-10-CM | POA: Diagnosis not present

## 2020-02-01 DIAGNOSIS — F419 Anxiety disorder, unspecified: Secondary | ICD-10-CM | POA: Diagnosis not present

## 2020-02-01 DIAGNOSIS — Z0000 Encounter for general adult medical examination without abnormal findings: Secondary | ICD-10-CM | POA: Diagnosis not present

## 2020-02-01 DIAGNOSIS — F5101 Primary insomnia: Secondary | ICD-10-CM | POA: Diagnosis not present

## 2020-02-01 DIAGNOSIS — F3341 Major depressive disorder, recurrent, in partial remission: Secondary | ICD-10-CM | POA: Diagnosis not present

## 2020-02-01 DIAGNOSIS — Z9884 Bariatric surgery status: Secondary | ICD-10-CM | POA: Diagnosis not present

## 2020-06-22 ENCOUNTER — Encounter (HOSPITAL_COMMUNITY): Payer: Self-pay

## 2020-07-27 ENCOUNTER — Other Ambulatory Visit (HOSPITAL_COMMUNITY)
Admission: RE | Admit: 2020-07-27 | Discharge: 2020-07-27 | Disposition: A | Discharge status: Home / Self Care | Payer: 59 | Source: Ambulatory Visit | Attending: Adult Health | Admitting: Adult Health

## 2020-07-27 ENCOUNTER — Encounter: Payer: Self-pay | Admitting: Adult Health

## 2020-07-27 ENCOUNTER — Ambulatory Visit (INDEPENDENT_AMBULATORY_CARE_PROVIDER_SITE_OTHER): Payer: 59 | Admitting: Adult Health

## 2020-07-27 VITALS — BP 120/78 | HR 74 | Ht 62.0 in | Wt 215.0 lb

## 2020-07-27 DIAGNOSIS — Z1211 Encounter for screening for malignant neoplasm of colon: Secondary | ICD-10-CM | POA: Diagnosis not present

## 2020-07-27 DIAGNOSIS — Z01419 Encounter for gynecological examination (general) (routine) without abnormal findings: Secondary | ICD-10-CM | POA: Diagnosis not present

## 2020-07-27 LAB — HEMOCCULT GUIAC POC 1CARD (OFFICE): Fecal Occult Blood, POC: NEGATIVE

## 2020-07-27 NOTE — Progress Notes (Signed)
Patient ID: [name], female   DOB: Apr 20, 1971, 49 y.o.   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  She has had COVID vaccine.  PCP is Dr Mannie Stabile.  Current Medications, Allergies, Past Medical History, Past Surgical History, Family History and Social History were reviewed in [employer] record.     Review of Systems: Patient denies any hearing loss, fatigue, blurred vision, shortness of breath, chest pain, abdominal pain, problems with bowel movements(has intermittent constipation at times), urination, or intercourse(not currently active). No joint pain or mood swings. She has headaches and is taking flexeril and seeing chiropractor and it has helped, has bone spurs. Denies any vaginal bleeding. She had sleeve surgery in 2018 and had lost about 60 lbs but gained some back.    Physical Exam:BP 120/78 (BP Location: Right Arm, Patient Position: Sitting, Cuff Size: Large)   Pulse 74   Ht 5\' 2"  (1.575 m)   Wt 215 lb (97.5 kg)   LMP 09/18/2017   BMI 39.32 kg/m  General:  Well developed, well nourished, no acute distress Skin:  Warm and dry Neck:  Midline trachea, normal thyroid, good ROM, no lymphadenopathy Lungs; Clear to auscultation bilaterally Breast:  No dominant palpable mass, retraction, or nipple discharge Cardiovascular: Regular rate and rhythm Abdomen:  Soft, non tender, no hepatosplenomegaly Pelvic:  External genitalia is normal in appearance, no lesions.  The vagina is normal in appearance. Urethra has no lesions or masses. The cervix is bulbous.Pap with high risk HPV genotyping performed.  Uterus is felt to be normal size, shape, and contour.  No adnexal masses or tenderness noted.Bladder is non tender, no masses felt. Rectal: Good sphincter tone, no polyps, or hemorrhoids felt.  Hemoccult  negative. Extremities/musculoskeletal:  No swelling or varicosities noted, no clubbing or cyanosis Psych:  No mood changes, alert and cooperative,seems happy AA is 1 Fall risk is low PHQ 9 score is 8, no SI, is on meds    Upstream - 07/27/20 0837      Pregnancy Intention Screening   Does the patient want to become pregnant in the next year? No    Does the patient's partner want to become pregnant in the next year? No    Would the patient like to discuss contraceptive options today? No      Contraception Wrap Up   Current Method Female Sterilization    End Method Female Sterilization    Contraception Counseling Provided No         Examination chaperoned by Genia Hotter RN  Impression and Plan:  1. Encounter for gynecological examination with Papanicolaou smear of cervix Pap sent Physical in 1 year Pap in 3 if normal Get mammogram  Labs with PCP Discussed cologuard and colonoscopy   2. Encounter for screening fecal occult blood testing

## 2020-07-28 ENCOUNTER — Other Ambulatory Visit (HOSPITAL_COMMUNITY): Payer: Self-pay | Admitting: Adult Health

## 2020-07-28 DIAGNOSIS — Z1231 Encounter for screening mammogram for malignant neoplasm of breast: Secondary | ICD-10-CM

## 2020-07-28 LAB — CYTOLOGY - PAP
Adequacy: ABSENT
Comment: NEGATIVE
Diagnosis: NEGATIVE
High risk HPV: NEGATIVE

## 2020-08-07 ENCOUNTER — Other Ambulatory Visit: Payer: Self-pay

## 2020-08-07 ENCOUNTER — Ambulatory Visit (HOSPITAL_COMMUNITY)
Admission: RE | Admit: 2020-08-07 | Discharge: 2020-08-07 | Disposition: A | Discharge status: Home / Self Care | Payer: 59 | Source: Ambulatory Visit | Attending: Adult Health | Admitting: Adult Health

## 2020-08-07 DIAGNOSIS — Z1231 Encounter for screening mammogram for malignant neoplasm of breast: Secondary | ICD-10-CM | POA: Diagnosis not present

## 2020-10-12 ENCOUNTER — Other Ambulatory Visit (HOSPITAL_COMMUNITY): Payer: Self-pay | Admitting: Family Medicine

## 2020-10-12 DIAGNOSIS — Z9884 Bariatric surgery status: Secondary | ICD-10-CM | POA: Diagnosis not present

## 2020-10-12 DIAGNOSIS — E782 Mixed hyperlipidemia: Secondary | ICD-10-CM | POA: Diagnosis not present

## 2020-10-12 DIAGNOSIS — F3341 Major depressive disorder, recurrent, in partial remission: Secondary | ICD-10-CM | POA: Diagnosis not present

## 2020-10-12 DIAGNOSIS — F419 Anxiety disorder, unspecified: Secondary | ICD-10-CM | POA: Diagnosis not present

## 2020-10-12 DIAGNOSIS — G44209 Tension-type headache, unspecified, not intractable: Secondary | ICD-10-CM | POA: Diagnosis not present

## 2020-10-12 DIAGNOSIS — F5101 Primary insomnia: Secondary | ICD-10-CM | POA: Diagnosis not present

## 2020-10-13 MED FILL — OZEMPIC (1 MG/DOSE) 4 MG/3M: 4 | 28 days supply | Qty: 3 | Fill #0

## 2020-10-13 MED FILL — SERTRALINE HCL 100 MG TAB: 100 | 90 days supply | Qty: 180 | Fill #0

## 2020-10-13 MED FILL — busPIRone HCL 10 MG TABS: 10 | 90 days supply | Qty: 180 | Fill #0

## 2020-11-02 MED FILL — ZOLPIDEM TARTRATE 10 MG TAB: 10 | 90 days supply | Qty: 90 | Fill #0

## 2021-01-23 DIAGNOSIS — H524 Presbyopia: Secondary | ICD-10-CM | POA: Diagnosis not present

## 2021-01-23 DIAGNOSIS — H5203 Hypermetropia, bilateral: Secondary | ICD-10-CM | POA: Diagnosis not present

## 2021-01-23 MED FILL — SERTRALINE HCL 100 MG TAB: 100 | 90 days supply | Qty: 180 | Fill #1

## 2021-01-23 MED FILL — busPIRone HCL 10 MG TABS: 10 | 90 days supply | Qty: 180 | Fill #1

## 2021-01-23 MED FILL — ZOLPIDEM TARTRATE 10 MG TAB: 10 | 90 days supply | Qty: 90 | Fill #1

## 2021-02-01 ENCOUNTER — Other Ambulatory Visit (HOSPITAL_COMMUNITY): Payer: Self-pay | Admitting: Family Medicine

## 2021-02-01 MED FILL — SCOPOLAMINE 1 MG/3DAYS PT72: 1 | 27 days supply | Qty: 9 | Fill #0

## 2021-02-26 ENCOUNTER — Other Ambulatory Visit: Payer: Self-pay

## 2021-02-26 ENCOUNTER — Ambulatory Visit
Admission: RE | Admit: 2021-02-26 | Discharge: 2021-02-26 | Disposition: A | Discharge status: Home / Self Care | Payer: 59 | Source: Ambulatory Visit | Attending: Family Medicine | Admitting: Family Medicine

## 2021-02-26 VITALS — BP 125/83 | HR 76 | Temp 98.5°F | Resp 18

## 2021-02-26 DIAGNOSIS — J0100 Acute maxillary sinusitis, unspecified: Secondary | ICD-10-CM

## 2021-02-26 DIAGNOSIS — R059 Cough, unspecified: Secondary | ICD-10-CM

## 2021-02-26 MED ORDER — HYDROCOD POLST-CPM POLST ER 10-8 MG/5ML PO SUER: 5.0000 mL | Freq: Two times a day (BID) | ORAL | 0 refills | Status: DC | PRN

## 2021-02-26 MED ORDER — LEVOCETIRIZINE DIHYDROCHLORIDE 5 MG PO TABS: 5.0000 mg | ORAL_TABLET | Freq: Every evening | ORAL | 0 refills | Status: DC

## 2021-02-26 MED ORDER — DOXYCYCLINE HYCLATE 100 MG PO CAPS
100.0000 mg | ORAL_CAPSULE | Freq: Two times a day (BID) | ORAL | 0 refills | Status: DC
Start: 2021-02-26 — End: 2021-03-29
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
## 2021-02-26 NOTE — ED Triage Notes (Signed)
Cough and congestion since last Sunday.  Coughing up yellow.  2 neg at home covid test.

## 2021-03-13 ENCOUNTER — Other Ambulatory Visit (HOSPITAL_COMMUNITY): Payer: Self-pay | Admitting: Family Medicine

## 2021-03-13 DIAGNOSIS — F332 Major depressive disorder, recurrent severe without psychotic features: Secondary | ICD-10-CM | POA: Diagnosis not present

## 2021-03-13 DIAGNOSIS — Z720 Tobacco use: Secondary | ICD-10-CM | POA: Diagnosis not present

## 2021-03-13 DIAGNOSIS — E782 Mixed hyperlipidemia: Secondary | ICD-10-CM | POA: Diagnosis not present

## 2021-03-13 DIAGNOSIS — F419 Anxiety disorder, unspecified: Secondary | ICD-10-CM | POA: Diagnosis not present

## 2021-03-13 DIAGNOSIS — Z0000 Encounter for general adult medical examination without abnormal findings: Secondary | ICD-10-CM | POA: Diagnosis not present

## 2021-03-13 DIAGNOSIS — F5101 Primary insomnia: Secondary | ICD-10-CM | POA: Diagnosis not present

## 2021-03-13 MED FILL — buPROPion HCL ER (XL) 150 M: 150 | 30 days supply | Qty: 30 | Fill #0

## 2021-03-13 MED FILL — ALPRAZolam 0.5 MG TABS: 0.5 | 90 days supply | Qty: 90 | Fill #0

## 2021-03-13 MED FILL — OZEMPIC (1 MG/DOSE) 4 MG/3M: 4 | 28 days supply | Qty: 3 | Fill #1

## 2021-03-14 ENCOUNTER — Other Ambulatory Visit (HOSPITAL_COMMUNITY): Payer: Self-pay | Admitting: Family Medicine

## 2021-03-29 ENCOUNTER — Other Ambulatory Visit: Payer: Self-pay

## 2021-03-29 ENCOUNTER — Encounter: Payer: Self-pay | Admitting: Emergency Medicine

## 2021-03-29 ENCOUNTER — Ambulatory Visit
Admission: EM | Admit: 2021-03-29 | Discharge: 2021-03-29 | Disposition: A | Discharge status: Home / Self Care | Payer: 59 | Attending: Internal Medicine | Admitting: Internal Medicine

## 2021-03-29 DIAGNOSIS — S20461A Insect bite (nonvenomous) of right back wall of thorax, initial encounter: Secondary | ICD-10-CM

## 2021-03-29 DIAGNOSIS — S20162A Insect bite (nonvenomous) of breast, left breast, initial encounter: Secondary | ICD-10-CM

## 2021-03-29 DIAGNOSIS — W57XXXA Bitten or stung by nonvenomous insect and other nonvenomous arthropods, initial encounter: Secondary | ICD-10-CM

## 2021-03-29 MED ORDER — DOXYCYCLINE HYCLATE 100 MG PO CAPS: 100.0000 mg | ORAL_CAPSULE | Freq: Two times a day (BID) | ORAL | 0 refills | Status: DC

## 2021-03-29 NOTE — ED Triage Notes (Signed)
Tick bites to LT breast and RT lower back that are red .

## 2021-04-09 MED FILL — Bupropion HCl Tab ER 24HR 150 MG: ORAL | 30 days supply | Qty: 30 | Fill #0 | Status: AC

## 2021-04-10 ENCOUNTER — Other Ambulatory Visit (HOSPITAL_COMMUNITY): Payer: Self-pay

## 2021-04-17 ENCOUNTER — Other Ambulatory Visit (HOSPITAL_COMMUNITY): Payer: Self-pay

## 2021-04-27 ENCOUNTER — Other Ambulatory Visit: Payer: Self-pay

## 2021-04-27 ENCOUNTER — Other Ambulatory Visit (HOSPITAL_COMMUNITY): Payer: Self-pay

## 2021-04-27 DIAGNOSIS — F419 Anxiety disorder, unspecified: Secondary | ICD-10-CM | POA: Diagnosis not present

## 2021-04-27 DIAGNOSIS — F5101 Primary insomnia: Secondary | ICD-10-CM | POA: Diagnosis not present

## 2021-04-27 DIAGNOSIS — F332 Major depressive disorder, recurrent severe without psychotic features: Secondary | ICD-10-CM | POA: Diagnosis not present

## 2021-04-27 DIAGNOSIS — E559 Vitamin D deficiency, unspecified: Secondary | ICD-10-CM | POA: Diagnosis not present

## 2021-04-27 DIAGNOSIS — Z9884 Bariatric surgery status: Secondary | ICD-10-CM | POA: Diagnosis not present

## 2021-04-27 DIAGNOSIS — E538 Deficiency of other specified B group vitamins: Secondary | ICD-10-CM | POA: Diagnosis not present

## 2021-04-27 MED ORDER — BUPROPION HCL ER (XL) 150 MG PO TB24
150.0000 mg | ORAL_TABLET | Freq: Every morning | ORAL | 1 refills | Status: DC
Start: 2021-04-27 — End: 2021-07-31
  Filled 2021-04-27 – 2021-06-06 (×2): qty 90, 90d supply, fill #0

## 2021-04-27 MED ORDER — SERTRALINE HCL 100 MG PO TABS
200.0000 mg | ORAL_TABLET | Freq: Every day | ORAL | 1 refills | Status: DC
  Filled 2021-04-27: qty 180, 90d supply, fill #0
  Filled 2021-07-25: qty 180, 90d supply, fill #1
  Filled 2021-07-26: qty 180, 90d supply, fill #0

## 2021-04-27 MED FILL — Zolpidem Tartrate Tab 10 MG: ORAL | 90 days supply | Qty: 90 | Fill #0 | Status: AC

## 2021-04-27 MED FILL — Alprazolam Tab 0.5 MG: ORAL | 90 days supply | Qty: 180 | Fill #0 | Status: CN

## 2021-04-27 MED FILL — Semaglutide Soln Pen-inj 1 MG/DOSE (4 MG/3ML): SUBCUTANEOUS | 84 days supply | Qty: 9 | Fill #0 | Status: AC

## 2021-04-28 ENCOUNTER — Other Ambulatory Visit (HOSPITAL_COMMUNITY): Payer: Self-pay

## 2021-04-30 ENCOUNTER — Other Ambulatory Visit (HOSPITAL_COMMUNITY): Payer: Self-pay

## 2021-05-02 ENCOUNTER — Other Ambulatory Visit (HOSPITAL_COMMUNITY): Payer: Self-pay

## 2021-05-02 MED FILL — Alprazolam Tab 0.5 MG: ORAL | 90 days supply | Qty: 180 | Fill #0 | Status: CN

## 2021-06-07 ENCOUNTER — Other Ambulatory Visit (HOSPITAL_COMMUNITY): Payer: Self-pay

## 2021-06-21 ENCOUNTER — Encounter (HOSPITAL_COMMUNITY): Payer: Self-pay | Admitting: *Deleted

## 2021-07-09 ENCOUNTER — Other Ambulatory Visit (HOSPITAL_COMMUNITY): Payer: Self-pay

## 2021-07-09 MED FILL — Alprazolam Tab 0.5 MG: ORAL | 90 days supply | Qty: 180 | Fill #0 | Status: AC

## 2021-07-10 ENCOUNTER — Encounter (INDEPENDENT_AMBULATORY_CARE_PROVIDER_SITE_OTHER): Payer: Self-pay | Admitting: Family Medicine

## 2021-07-10 ENCOUNTER — Other Ambulatory Visit: Payer: Self-pay

## 2021-07-10 ENCOUNTER — Other Ambulatory Visit (HOSPITAL_COMMUNITY): Payer: Self-pay

## 2021-07-10 ENCOUNTER — Ambulatory Visit (INDEPENDENT_AMBULATORY_CARE_PROVIDER_SITE_OTHER): Payer: 59 | Admitting: Family Medicine

## 2021-07-10 VITALS — BP 122/84 | HR 76 | Temp 98.1°F | Ht 62.0 in | Wt 195.0 lb

## 2021-07-10 DIAGNOSIS — F32A Depression, unspecified: Secondary | ICD-10-CM | POA: Diagnosis not present

## 2021-07-10 DIAGNOSIS — G4709 Other insomnia: Secondary | ICD-10-CM | POA: Diagnosis not present

## 2021-07-10 DIAGNOSIS — E66812 Obesity, class 2: Secondary | ICD-10-CM

## 2021-07-10 DIAGNOSIS — R0602 Shortness of breath: Secondary | ICD-10-CM

## 2021-07-10 DIAGNOSIS — R5383 Other fatigue: Secondary | ICD-10-CM

## 2021-07-10 DIAGNOSIS — E538 Deficiency of other specified B group vitamins: Secondary | ICD-10-CM

## 2021-07-10 DIAGNOSIS — Z9189 Other specified personal risk factors, not elsewhere classified: Secondary | ICD-10-CM | POA: Diagnosis not present

## 2021-07-10 DIAGNOSIS — Z6835 Body mass index (BMI) 35.0-35.9, adult: Secondary | ICD-10-CM

## 2021-07-10 DIAGNOSIS — K5909 Other constipation: Secondary | ICD-10-CM | POA: Diagnosis not present

## 2021-07-10 DIAGNOSIS — Z0289 Encounter for other administrative examinations: Secondary | ICD-10-CM

## 2021-07-10 DIAGNOSIS — F419 Anxiety disorder, unspecified: Secondary | ICD-10-CM

## 2021-07-10 DIAGNOSIS — F17200 Nicotine dependence, unspecified, uncomplicated: Secondary | ICD-10-CM

## 2021-07-10 DIAGNOSIS — E559 Vitamin D deficiency, unspecified: Secondary | ICD-10-CM

## 2021-07-10 DIAGNOSIS — E7849 Other hyperlipidemia: Secondary | ICD-10-CM

## 2021-07-10 DIAGNOSIS — Z903 Acquired absence of stomach [part of]: Secondary | ICD-10-CM

## 2021-07-10 DIAGNOSIS — R0683 Snoring: Secondary | ICD-10-CM

## 2021-07-10 MED ORDER — PHENTERMINE HCL 37.5 MG PO TABS
37.5000 mg | ORAL_TABLET | Freq: Every day | ORAL | 0 refills | Status: DC
  Filled 2021-07-10: qty 30, 30d supply, fill #0

## 2021-07-11 LAB — COMPREHENSIVE METABOLIC PANEL
ALT: 6 IU/L (ref 0–32)
AST: 12 IU/L (ref 0–40)
Albumin/Globulin Ratio: 1.6 (ref 1.2–2.2)
Albumin: 4.2 g/dL (ref 3.8–4.8)
Alkaline Phosphatase: 79 IU/L (ref 44–121)
BUN/Creatinine Ratio: 17 (ref 9–23)
BUN: 11 mg/dL (ref 6–24)
Bilirubin Total: 0.3 mg/dL (ref 0.0–1.2)
CO2: 23 mmol/L (ref 20–29)
Calcium: 9.4 mg/dL (ref 8.7–10.2)
Chloride: 102 mmol/L (ref 96–106)
Creatinine, Ser: 0.63 mg/dL (ref 0.57–1.00)
Globulin, Total: 2.7 g/dL (ref 1.5–4.5)
Glucose: 80 mg/dL (ref 65–99)
Potassium: 4.5 mmol/L (ref 3.5–5.2)
Sodium: 144 mmol/L (ref 134–144)
Total Protein: 6.9 g/dL (ref 6.0–8.5)
eGFR: 109 mL/min/{1.73_m2} (ref >59)

## 2021-07-11 LAB — T4, FREE: Free T4: 1.12 ng/dL (ref 0.82–1.77)

## 2021-07-11 LAB — INSULIN, RANDOM: INSULIN: 10.8 u[IU]/mL (ref 2.6–24.9)

## 2021-07-11 LAB — TSH: TSH: 1.12 u[IU]/mL (ref 0.450–4.500)
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
## 2021-07-24 ENCOUNTER — Encounter (INDEPENDENT_AMBULATORY_CARE_PROVIDER_SITE_OTHER): Payer: Self-pay | Admitting: Family Medicine

## 2021-07-24 ENCOUNTER — Other Ambulatory Visit: Payer: Self-pay

## 2021-07-24 ENCOUNTER — Ambulatory Visit (INDEPENDENT_AMBULATORY_CARE_PROVIDER_SITE_OTHER): Payer: 59 | Admitting: Family Medicine

## 2021-07-24 ENCOUNTER — Other Ambulatory Visit (HOSPITAL_COMMUNITY): Payer: Self-pay

## 2021-07-24 VITALS — BP 112/74 | HR 76 | Temp 98.2°F | Ht 62.0 in | Wt 191.0 lb

## 2021-07-24 DIAGNOSIS — Z9189 Other specified personal risk factors, not elsewhere classified: Secondary | ICD-10-CM | POA: Diagnosis not present

## 2021-07-24 DIAGNOSIS — E559 Vitamin D deficiency, unspecified: Secondary | ICD-10-CM

## 2021-07-24 DIAGNOSIS — R632 Polyphagia: Secondary | ICD-10-CM | POA: Diagnosis not present

## 2021-07-24 DIAGNOSIS — Z903 Acquired absence of stomach [part of]: Secondary | ICD-10-CM

## 2021-07-24 DIAGNOSIS — Z6835 Body mass index (BMI) 35.0-35.9, adult: Secondary | ICD-10-CM

## 2021-07-24 DIAGNOSIS — F3289 Other specified depressive episodes: Secondary | ICD-10-CM | POA: Diagnosis not present

## 2021-07-24 MED ORDER — PHENTERMINE HCL 37.5 MG PO TABS
37.5000 mg | ORAL_TABLET | Freq: Every day | ORAL | 0 refills | Status: DC
Start: 2021-07-24 — End: 2021-09-25
  Filled 2021-07-24 – 2021-08-11 (×2): qty 30, 30d supply, fill #0

## 2021-07-25 ENCOUNTER — Other Ambulatory Visit: Payer: Self-pay

## 2021-07-25 MED FILL — Semaglutide Soln Pen-inj 1 MG/DOSE (4 MG/3ML): SUBCUTANEOUS | 28 days supply | Qty: 3 | Fill #1 | Status: CN

## 2021-07-25 NOTE — Progress Notes (Signed)
Chief Complaint:   OBESITY [name] is here to discuss her progress with her obesity treatment plan along with follow-up of her obesity related diagnoses.   Today's visit was #: 2 Starting weight: 195 lbs Starting date: 07/10/2021 Today's weight: 191 lbs Today's date: 07/24/2021 Weight change since last visit: 4 lbs Total lbs lost to date: 4 lbs Body mass index is 34.93 kg/m.  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  Phentermine was prescribed at her first visit.  She says she has increased her walking and increased her water intake.  She has 2 classes left.  Husband had scrotal implant surgery since our last visit.  Current Meal Plan: practicing portion control and making smarter food choices, such as increasing vegetables and decreasing simple carbohydrates for 0% of the time.  Current Exercise Plan: Walking for 30 minutes 2-3 times per week.  Assessment/Plan:   1. Polyphagia Current treatment: Wegovy 0.75 mg subcutaneously weekly, phentermine 37.5 mg daily. She will continue to focus on protein-rich, low simple carbohydrate foods. We reviewed the importance of hydration, regular exercise for stress reduction, and restorative sleep.  Having again reminded the patient of the "off label" use of Phentermine beyond three consecutive months, and again discussing the risks, benefits, contraindications, and limitations of it's use; given it's role in the successful treatment of obesity thus far and lack of adverse effect, patient has expressed desire and given informed verbal consent to continue use.   I have consulted the [place] Controlled Substances Registry for this patient, and feel the risk/benefit ratio today is favorable for proceeding with this prescription for a controlled substance. The patient understands monitoring parameters and red flags.   - Refill phentermine (ADIPEX-P) 37.5 MG tablet; Take 1 tablet (37.5 mg  total) by mouth daily.  Dispense: 30 tablet; Refill: 0  2. History of sleeve gastrectomy [name] is at risk for malnutrition due to her previous bariatric surgery.   Counseling You may need to eat 3 meals and 2 snacks, or 5 small meals each day in order to reach your protein and calorie goals.  Allow at least 15 minutes for each meal so that you can eat mindfully. Listen to your body so that you do not overeat. For most people, your sleeve or pouch will comfortably hold 4-6 ounces. Eat foods from all food groups. This includes fruits and vegetables, grains, dairy, and meat and other proteins. Include a protein-rich food at every meal and snack, and eat the protein food first.  You should be taking a Bariatric Multivitamin as well as calcium.   3. Vitamin D deficiency Not at goal.   Plan: Continue current OTC vitamin D supplementation.  Follow-up for routine testing of Vitamin D, at least 2-3 times per year to avoid over-replacement.  Lab Results  Component Value Date   VD25OH 25 (L) 06/05/2017   VD25OH 18.1 (L) 12/01/2015   VD25OH 19.1 (L) 04/12/2014   4. Other depression, with emotional eating Not at goal. Medication: Wellbutrin XL 150 mg in the morning, Zoloft 200 mg daily.  Plan:  Will place referral to Psychology today.  - Ambulatory referral to Psychology  5. At risk for heart disease Due to [facility] current state of health and medical condition(s), she is at a higher risk for heart disease.  This puts the patient at much greater risk to subsequently develop cardiopulmonary conditions that can significantly affect patient's quality of life  in a negative manner.    At least 8 minutes were spent on counseling [name] about these concerns today. Evidence-based interventions for health behavior change were utilized today including the discussion of self monitoring techniques, problem-solving barriers, and SMART goal setting techniques.  Specifically, regarding patient's less desirable  eating habits and patterns, we employed the technique of small changes when [name] has not been able to fully commit to her prudent nutritional plan.  6. Obesity, current BMI 34.9  Course: [name] is currently in the action stage of change. As such, her goal is to continue with weight loss efforts.   Nutrition goals: She has agreed to practicing portion control and making smarter food choices, such as increasing vegetables and decreasing simple carbohydrates.   Exercise goals:  As is.  Behavioral modification strategies: increasing lean protein intake, decreasing simple carbohydrates, increasing vegetables, and emotional eating strategies.  [name] has agreed to follow-up with our clinic in 4 weeks. She was informed of the importance of frequent follow-up visits to maximize her success with intensive lifestyle modifications for her multiple health conditions.   Objective:   Blood pressure 112/74, pulse 76, temperature 98.2 F (36.8 C), temperature source Oral, height '5\' 2"'$  (1.575 m), weight 191 lb (86.6 kg), last menstrual period 09/18/2017, SpO2 96 %. Body mass index is 34.93 kg/m.  General: Cooperative, alert, well developed, in no acute distress. HEENT: Conjunctivae and lids unremarkable. Cardiovascular: Regular rhythm.  Lungs: Normal work of breathing. Neurologic: No focal deficits.   Lab Results  Component Value Date   CREATININE 0.63 07/10/2021   BUN 11 07/10/2021   NA 144 07/10/2021   K 4.5 07/10/2021   CL 102 07/10/2021   CO2 23 07/10/2021   Lab Results  Component Value Date   ALT 6 07/10/2021   AST 12 07/10/2021   ALKPHOS 79 07/10/2021   BILITOT 0.3 07/10/2021   Lab Results  Component Value Date   HGBA1C 5.5 04/12/2014   Lab Results  Component Value Date   INSULIN 10.8 07/10/2021   Lab Results  Component Value Date   TSH 1.120 07/10/2021   Lab Results  Component Value Date   CHOL 196 06/05/2017   HDL 57 06/05/2017   LDLCALC 119 (H) 06/05/2017   TRIG 99  06/05/2017   CHOLHDL 3.4 06/05/2017   Lab Results  Component Value Date   VD25OH 25 (L) 06/05/2017   VD25OH 18.1 (L) 12/01/2015   VD25OH 19.1 (L) 04/12/2014   Lab Results  Component Value Date   WBC 12.7 (H) 11/26/2017   HGB 12.7 11/26/2017   HCT 39.2 11/26/2017   MCV 89.7 11/26/2017   PLT 352 11/26/2017   Attestation Statements:   Reviewed by clinician on day of visit: allergies, medications, problem list, medical history, surgical history, family history, social history, and previous encounter notes.  I, Water quality scientist, CMA, am acting as transcriptionist for Briscoe Deutscher, DO  I have reviewed the above documentation for accuracy and completeness, and I agree with the above. Briscoe Deutscher, DO

## 2021-07-26 ENCOUNTER — Other Ambulatory Visit (HOSPITAL_COMMUNITY): Payer: Self-pay

## 2021-07-26 DIAGNOSIS — F332 Major depressive disorder, recurrent severe without psychotic features: Secondary | ICD-10-CM | POA: Diagnosis not present

## 2021-07-26 DIAGNOSIS — F5101 Primary insomnia: Secondary | ICD-10-CM | POA: Diagnosis not present

## 2021-07-26 DIAGNOSIS — F419 Anxiety disorder, unspecified: Secondary | ICD-10-CM | POA: Diagnosis not present

## 2021-07-26 MED ORDER — ZOLPIDEM TARTRATE 10 MG PO TABS
10.0000 mg | ORAL_TABLET | Freq: Every evening | ORAL | 1 refills | Status: DC
  Filled 2021-07-26: qty 90, 90d supply, fill #0
  Filled 2021-10-23: qty 90, 90d supply, fill #1

## 2021-07-26 MED ORDER — BUPROPION HCL ER (XL) 300 MG PO TB24
300.0000 mg | ORAL_TABLET | Freq: Every morning | ORAL | 0 refills | Status: DC
  Filled 2021-07-26: qty 90, 90d supply, fill #0

## 2021-07-26 MED ORDER — SERTRALINE HCL 50 MG PO TABS
150.0000 mg | ORAL_TABLET | Freq: Every day | ORAL | 0 refills | Status: DC
  Filled 2021-07-26: qty 270, 90d supply, fill #0

## 2021-07-26 MED ORDER — OZEMPIC (1 MG/DOSE) 4 MG/3ML ~~LOC~~ SOPN
PEN_INJECTOR | SUBCUTANEOUS | 0 refills | Status: DC
  Filled 2021-07-26: qty 9, 90d supply, fill #0
  Filled 2021-07-26: qty 3, 28d supply, fill #0
  Filled 2021-08-27: qty 6, 56d supply, fill #1

## 2021-07-31 ENCOUNTER — Encounter: Payer: Self-pay | Admitting: Orthopaedic Surgery

## 2021-07-31 ENCOUNTER — Other Ambulatory Visit: Payer: Self-pay

## 2021-07-31 ENCOUNTER — Ambulatory Visit: Payer: 59

## 2021-07-31 ENCOUNTER — Ambulatory Visit: Payer: 59 | Admitting: Orthopaedic Surgery

## 2021-07-31 VITALS — BP 134/85 | HR 97 | Ht 62.0 in | Wt 193.4 lb

## 2021-07-31 DIAGNOSIS — G8929 Other chronic pain: Secondary | ICD-10-CM

## 2021-07-31 DIAGNOSIS — F17210 Nicotine dependence, cigarettes, uncomplicated: Secondary | ICD-10-CM

## 2021-07-31 DIAGNOSIS — M25561 Pain in right knee: Secondary | ICD-10-CM | POA: Diagnosis not present

## 2021-07-31 NOTE — Patient Instructions (Addendum)
Your insurance does not require approval for MRI please go ahead and call to schedule your appointment with [facility] within at least one (1) week.   Central Scheduling [phone] Steps to Quit Smoking Smoking tobacco is the leading cause of preventable death. It can affect almost every organ in the body. Smoking puts you and people around you at risk for many serious, long-lasting (chronic) diseases. Quitting smoking can be hard, but it is one of the best things thatyou can do for your health. It is never too late to quit. How do I get ready to quit? When you decide to quit smoking, make a plan to help you succeed. Before you quit: Pick a date to quit. Set a date within the next 2 weeks to give you time to prepare. Write down the reasons why you are quitting. Keep this list in places where you will see it often. Tell your family, friends, and co-workers that you are quitting. Their support is important. Talk with your doctor about the choices that may help you quit. Find out if your health insurance will pay for these treatments. Know the people, places, things, and activities that make you want to smoke (triggers). Avoid them. What first steps can I take to quit smoking? Throw away all cigarettes at home, at work, and in your car. Throw away the things that you use when you smoke, such as ashtrays and lighters. Clean your car. Make sure to empty the ashtray. Clean your home, including curtains and carpets. What can I do to help me quit smoking? Talk with your doctor about taking medicines and seeing a counselor at the same time. You are more likely to succeed when you do both. If you are pregnant or breastfeeding, talk with your doctor about counseling or other ways to quit smoking. Do not take medicine to help you quit smoking unless your doctor tells you to do so. To quit smoking: Quit right away Quit smoking totally, instead of slowly cutting back on how much you smoke over a  period of time. Go to counseling. You are more likely to quit if you go to counseling sessions regularly. Take medicine You may take medicines to help you quit. Some medicines need a prescription, and some you can buy over-the-counter. Some medicines may contain a drug called nicotine to replace the nicotine in cigarettes. Medicines may: Help you to stop having the desire to smoke (cravings). Help to stop the problems that come when you stop smoking (withdrawal symptoms). Your doctor may ask you to use: Nicotine patches, gum, or lozenges. Nicotine inhalers or sprays. Non-nicotine medicine that is taken by mouth. Find resources Find resources and other ways to help you quit smoking and remain smoke-free after you quit. These resources are most helpful when you use them often. They include: Online chats with a Social worker. Phone quitlines. Printed Furniture conservator/restorer. Support groups or group counseling. Text messaging programs. Mobile phone apps. Use apps on your mobile phone or tablet that can help you stick to your quit plan. There are many free apps for mobile phones and tablets as well as websites. Examples include Quit Guide from the [employer] and smokefree.gov  What things can I do to make it easier to quit?  Talk to your family and friends. Ask them to support and encourage you. Call a phone quitline (1-800-QUIT-NOW), reach out to support groups, or work with a Social worker. Ask people who smoke to not smoke around you. Avoid places that make you  want to smoke, such as: Bars. Parties. Smoke-break areas at work. Spend time with people who do not smoke. Lower the stress in your life. Stress can make you want to smoke. Try these things to help your stress: Getting regular exercise. Doing deep-breathing exercises. Doing yoga. Meditating. Doing a body scan. To do this, close your eyes, focus on one area of your body at a time from head to toe. Notice which parts of your body are tense. Try to  relax the muscles in those areas. How will I feel when I quit smoking? Day 1 to 3 weeks Within the first 24 hours, you may start to have some problems that come from quitting tobacco. These problems are very bad 2-3 days after you quit, but they do not often last for more than 2-3 weeks. You may get these symptoms: Mood swings. Feeling restless, nervous, angry, or annoyed. Trouble concentrating. Dizziness. Strong desire for high-sugar foods and nicotine. Weight gain. Trouble pooping (constipation). Feeling like you may vomit (nausea). Coughing or a sore throat. Changes in how the medicines that you take for other issues work in your body. Depression. Trouble sleeping (insomnia). Week 3 and afterward After the first 2-3 weeks of quitting, you may start to notice more positive results, such as: Better sense of smell and taste. Less coughing and sore throat. Slower heart rate. Lower blood pressure. Clearer skin. Better breathing. Fewer sick days. Quitting smoking can be hard. Do not give up if you fail the first time. Some people need to try a few times before they succeed. Do your best to stick to your quit plan, and talk with yourdoctor if you have any questions or concerns. Summary Smoking tobacco is the leading cause of preventable death. Quitting smoking can be hard, but it is one of the best things that you can do for your health. When you decide to quit smoking, make a plan to help you succeed. Quit smoking right away, not slowly over a period of time. When you start quitting, seek help from your doctor, family, or friends. This information is not intended to replace advice given to you by your health care provider. Make sure you discuss any questions you have with your healthcare provider. Document Revised: 08/27/2019 Document Reviewed: 02/20/2019 Elsevier Patient Education  [place].
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
## 2021-08-08 ENCOUNTER — Other Ambulatory Visit: Payer: Self-pay

## 2021-08-08 ENCOUNTER — Ambulatory Visit (HOSPITAL_COMMUNITY)
Admission: RE | Admit: 2021-08-08 | Discharge: 2021-08-08 | Disposition: A | Discharge status: Home / Self Care | Payer: 59 | Source: Ambulatory Visit | Attending: Orthopaedic Surgery | Admitting: Orthopaedic Surgery

## 2021-08-08 DIAGNOSIS — M25561 Pain in right knee: Secondary | ICD-10-CM | POA: Insufficient documentation

## 2021-08-08 DIAGNOSIS — G8929 Other chronic pain: Secondary | ICD-10-CM | POA: Diagnosis not present

## 2021-08-11 ENCOUNTER — Other Ambulatory Visit (HOSPITAL_COMMUNITY): Payer: Self-pay

## 2021-08-13 ENCOUNTER — Other Ambulatory Visit (HOSPITAL_COMMUNITY): Payer: Self-pay

## 2021-08-16 ENCOUNTER — Encounter: Payer: Self-pay | Admitting: Orthopaedic Surgery

## 2021-08-16 ENCOUNTER — Ambulatory Visit: Payer: 59 | Admitting: Orthopaedic Surgery

## 2021-08-16 ENCOUNTER — Other Ambulatory Visit: Payer: Self-pay

## 2021-08-16 DIAGNOSIS — S83281A Other tear of lateral meniscus, current injury, right knee, initial encounter: Secondary | ICD-10-CM

## 2021-08-16 DIAGNOSIS — G8929 Other chronic pain: Secondary | ICD-10-CM | POA: Diagnosis not present

## 2021-08-16 DIAGNOSIS — F17210 Nicotine dependence, cigarettes, uncomplicated: Secondary | ICD-10-CM | POA: Diagnosis not present

## 2021-08-16 NOTE — Patient Instructions (Signed)
Return to clinic to see Dr. Aline Brochure /Dr. Amedeo Kinsman
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
## 2021-08-21 ENCOUNTER — Ambulatory Visit: Payer: 59 | Admitting: Orthopaedic Surgery

## 2021-08-22 ENCOUNTER — Ambulatory Visit: Payer: 59 | Admitting: Orthopedic Surgery

## 2021-08-22 ENCOUNTER — Encounter: Payer: Self-pay | Admitting: Orthopedic Surgery

## 2021-08-22 ENCOUNTER — Other Ambulatory Visit: Payer: Self-pay

## 2021-08-22 VITALS — BP 137/81 | HR 83 | Ht 62.0 in | Wt 186.0 lb

## 2021-08-22 DIAGNOSIS — S83281A Other tear of lateral meniscus, current injury, right knee, initial encounter: Secondary | ICD-10-CM

## 2021-08-23 ENCOUNTER — Encounter: Payer: Self-pay | Admitting: Orthopedic Surgery
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
## 2021-08-27 ENCOUNTER — Other Ambulatory Visit (HOSPITAL_COMMUNITY): Payer: Self-pay

## 2021-09-03 ENCOUNTER — Ambulatory Visit (INDEPENDENT_AMBULATORY_CARE_PROVIDER_SITE_OTHER): Payer: 59 | Admitting: Family Medicine

## 2021-09-03 ENCOUNTER — Other Ambulatory Visit: Payer: Self-pay

## 2021-09-03 ENCOUNTER — Encounter (INDEPENDENT_AMBULATORY_CARE_PROVIDER_SITE_OTHER): Payer: Self-pay | Admitting: Family Medicine

## 2021-09-03 VITALS — BP 110/73 | HR 76 | Temp 98.0°F | Ht 62.0 in | Wt 184.0 lb

## 2021-09-03 DIAGNOSIS — F17211 Nicotine dependence, cigarettes, in remission: Secondary | ICD-10-CM | POA: Diagnosis not present

## 2021-09-03 DIAGNOSIS — R632 Polyphagia: Secondary | ICD-10-CM

## 2021-09-03 DIAGNOSIS — Z9189 Other specified personal risk factors, not elsewhere classified: Secondary | ICD-10-CM

## 2021-09-03 DIAGNOSIS — Z6835 Body mass index (BMI) 35.0-35.9, adult: Secondary | ICD-10-CM | POA: Diagnosis not present

## 2021-09-03 NOTE — Progress Notes (Signed)
Chief Complaint:   OBESITY [name] is here to discuss her progress with her obesity treatment plan along with follow-up of her obesity related diagnoses.   Today's visit was #: 3 Starting weight: 195 lbs Starting date: 07/10/2021 Today's weight: 184 lbs Today's date: 09/03/2021 Weight change since last visit: 7 lbs Total lbs lost to date: 11 lbs Body mass index is 33.65 kg/m.  Total weight loss percentage to date: -5.64%  Current Meal Plan: practicing portion control and making smarter food choices, such as increasing vegetables and decreasing simple carbohydrates for 0% of the time.  Current Exercise Plan: Walking for 30 minutes 2-3 times per week. Current Anti-Obesity Medications: phentermine 37.5 mg daily and Ozempic 1 mg subcutaneously weekly. Side effects: None.  Interim History:  [name] is having right knee pain - meniscus tear.  Watching per [name].  She has had no cigarettes for 8 days.  She says she went to the beach with her husband.  Her next goal is setting boundaries.    Assessment/Plan:   1. Polyphagia Current treatment: phentermine 37.5 mg daily and Ozempic 1 mg subcutaneously weekly. She will continue to focus on protein-rich, low simple carbohydrate foods. We reviewed the importance of hydration, regular exercise for stress reduction, and restorative sleep.  Plan:  Continue phentermine and Ozempic at current doses.  2. Cigarette nicotine dependence in remission [name] has not had any cigarettes in 8 days.   Plan: commended patient for quitting and reviewed strategies for preventing relapses.  3. At risk for malnutrition [name] was given extensive malnutrition prevention education and counseling today of more than 8 minutes.  She is at risk due to history of gastric sleeve. Counseled her that malnutrition refers to inappropriate nutrients or not the right balance of nutrients for optimal health.    4. Obesity, current BMI 33.8  Course: [name] is currently  in the action stage of change. As such, her goal is to continue with weight loss efforts.   Nutrition goals: She has agreed to practicing portion control and making smarter food choices, such as increasing vegetables and decreasing simple carbohydrates.   Exercise goals:  As is.  Behavioral modification strategies: increasing lean protein intake, decreasing simple carbohydrates, and increasing vegetables.  [name] has agreed to follow-up with our clinic in 3-4 weeks. She was informed of the importance of frequent follow-up visits to maximize her success with intensive lifestyle modifications for her multiple health conditions.   Objective:   Blood pressure 110/73, pulse 76, temperature 98 F (36.7 C), temperature source Oral, height '5\' 2"'$  (1.575 m), weight 184 lb (83.5 kg), last menstrual period 09/18/2017, SpO2 97 %. Body mass index is 33.65 kg/m.  General: Cooperative, alert, well developed, in no acute distress. HEENT: Conjunctivae and lids unremarkable. Cardiovascular: Regular rhythm.  Lungs: Normal work of breathing. Neurologic: No focal deficits.   Lab Results  Component Value Date   CREATININE 0.63 07/10/2021   BUN 11 07/10/2021   NA 144 07/10/2021   K 4.5 07/10/2021   CL 102 07/10/2021   CO2 23 07/10/2021   Lab Results  Component Value Date   ALT 6 07/10/2021   AST 12 07/10/2021   ALKPHOS 79 07/10/2021   BILITOT 0.3 07/10/2021   Lab Results  Component Value Date   HGBA1C 5.5 04/12/2014   Lab Results  Component Value Date   INSULIN 10.8 07/10/2021   Lab Results  Component Value Date   TSH 1.120 07/10/2021   Lab Results  Component Value  Date   CHOL 196 06/05/2017   HDL 57 06/05/2017   LDLCALC 119 (H) 06/05/2017   TRIG 99 06/05/2017   CHOLHDL 3.4 06/05/2017   Lab Results  Component Value Date   VD25OH 25 (L) 06/05/2017   VD25OH 18.1 (L) 12/01/2015   VD25OH 19.1 (L) 04/12/2014   Lab Results  Component Value Date   WBC 12.7 (H) 11/26/2017   HGB  12.7 11/26/2017   HCT 39.2 11/26/2017   MCV 89.7 11/26/2017   PLT 352 11/26/2017   Attestation Statements:   Reviewed by clinician on day of visit: allergies, medications, problem list, medical history, surgical history, family history, social history, and previous encounter notes.  I, Water quality scientist, CMA, am acting as transcriptionist for Briscoe Deutscher, DO  I have reviewed the above documentation for accuracy and completeness, and I agree with the above. Briscoe Deutscher, DO

## 2021-09-25 ENCOUNTER — Other Ambulatory Visit: Payer: Self-pay

## 2021-09-25 ENCOUNTER — Other Ambulatory Visit (HOSPITAL_COMMUNITY): Payer: Self-pay

## 2021-09-25 ENCOUNTER — Ambulatory Visit (INDEPENDENT_AMBULATORY_CARE_PROVIDER_SITE_OTHER): Payer: 59 | Admitting: Family Medicine

## 2021-09-25 ENCOUNTER — Encounter (INDEPENDENT_AMBULATORY_CARE_PROVIDER_SITE_OTHER): Payer: Self-pay | Admitting: Family Medicine

## 2021-09-25 VITALS — BP 116/72 | HR 80 | Temp 98.1°F | Ht 62.0 in | Wt 181.0 lb

## 2021-09-25 DIAGNOSIS — R632 Polyphagia: Secondary | ICD-10-CM | POA: Diagnosis not present

## 2021-09-25 DIAGNOSIS — Z6835 Body mass index (BMI) 35.0-35.9, adult: Secondary | ICD-10-CM | POA: Diagnosis not present

## 2021-09-25 DIAGNOSIS — F3289 Other specified depressive episodes: Secondary | ICD-10-CM | POA: Diagnosis not present

## 2021-09-25 DIAGNOSIS — Z9189 Other specified personal risk factors, not elsewhere classified: Secondary | ICD-10-CM | POA: Diagnosis not present

## 2021-09-25 MED ORDER — PHENTERMINE HCL 37.5 MG PO TABS
37.5000 mg | ORAL_TABLET | Freq: Every day | ORAL | 0 refills | Status: DC
Start: 2021-09-25 — End: 2021-10-23
  Filled 2021-09-25: qty 30, 30d supply, fill #0

## 2021-09-25 MED ORDER — TIRZEPATIDE 5 MG/0.5ML ~~LOC~~ SOAJ
5.0000 mg | SUBCUTANEOUS | 0 refills | Status: DC
Start: 2021-09-25 — End: 2021-10-23
  Filled 2021-09-25: qty 2, 28d supply, fill #0

## 2021-09-25 NOTE — Progress Notes (Signed)
Chief Complaint:   OBESITY [name] is here to discuss her progress with her obesity treatment plan along with follow-up of her obesity related diagnoses.   Today's visit was #: 4 Starting weight: 195 lbs Starting date: 07/10/2021 Today's weight: 181 lbs Today's date: 09/25/2021 Weight change since last visit: 3 lbs Total lbs lost to date: 14 lbs Body mass index is 33.11 kg/m.  Total weight loss percentage to date: -7.18%  Current Meal Plan: practicing portion control and making smarter food choices, such as increasing vegetables and decreasing simple carbohydrates for 100% of the time.  Current Exercise Plan: Walking for 20 minutes 1-3 times per week. Current Anti-Obesity Medications: phentermine 37.5 mg daily and Ozempic 1 mg subcutaneously weekly. Side effects: None.  Interim History: Her second goal is 179.9 pounds.  She says she is still not smoking.  She says she is getting better with boundaries.   Assessment/Plan:   1. Polyphagia Improving, but not optimized. Current treatment: Ozempic 1 mg subcutaneously weekly. She will continue to focus on protein-rich, low simple carbohydrate foods. We reviewed the importance of hydration, regular exercise for stress reduction, and restorative sleep.  Plan:  Continue phentermine.  Will refill today.  Stop Ozempic and start Mounjaro 5 mg subcutaneously weekly, as per below.  - Refill phentermine (ADIPEX-P) 37.5 MG tablet; Take 1 tablet (37.5 mg total) by mouth daily.  Dispense: 30 tablet; Refill: 0 - Start tirzepatide [facility]) 5 MG/0.5ML Pen; Inject 5 mg (1 pen) into the skin once a week.  Dispense: 2 mL; Refill: 0  Having again reminded the patient of the "off label" use of Phentermine beyond three consecutive months, and again discussing the risks, benefits, contraindications, and limitations of it's use; given it's role in the successful treatment of obesity thus far and lack of adverse effect, patient has expressed desire and  given informed verbal consent to continue use.   I have consulted the [place] Controlled Substances Registry for this patient, and feel the risk/benefit ratio today is favorable for proceeding with this prescription for a controlled substance. The patient understands monitoring parameters and red flags.   2. Other depression, with emotional eating Controlled. Medication: Wellbutrin XL 300 mg daily.  Currently weaning Zoloft and is now down to 150 mg daily.  Plan:  Discussed cues and consequences, how thoughts affect eating, model of thoughts, feelings, and behaviors, and strategies for change by focusing on the cue. Discussed cognitive distortions, coping thoughts, and how to change your thoughts.  3. Obesity, current BMI 33.3  Course: [name] is currently in the action stage of change. As such, her goal is to continue with weight loss efforts.   Nutrition goals: She has agreed to practicing portion control and making smarter food choices, such as increasing vegetables and decreasing simple carbohydrates.   Exercise goals: For substantial health benefits, adults should do at least 150 minutes (2 hours and 30 minutes) a week of moderate-intensity, or 75 minutes (1 hour and 15 minutes) a week of vigorous-intensity aerobic physical activity, or an equivalent combination of moderate- and vigorous-intensity aerobic activity. Aerobic activity should be performed in episodes of at least 10 minutes, and preferably, it should be spread throughout the week.  Behavioral modification strategies: increasing lean protein intake, decreasing simple carbohydrates, increasing vegetables, and increasing water intake.  [name] has agreed to follow-up with our clinic in 4 weeks. She was informed of the importance of frequent follow-up visits to maximize her success with intensive lifestyle modifications for her multiple  health conditions.   Objective:   Blood pressure 116/72, pulse 80, temperature 98.1 F (36.7 C),  temperature source Oral, height 5\' 2"  (1.575 m), weight 181 lb (82.1 kg), last menstrual period 09/18/2017, SpO2 95 %. Body mass index is 33.11 kg/m.  General: Cooperative, alert, well developed, in no acute distress. HEENT: Conjunctivae and lids unremarkable. Cardiovascular: Regular rhythm.  Lungs: Normal work of breathing. Neurologic: No focal deficits.   Lab Results  Component Value Date   CREATININE 0.63 07/10/2021   BUN 11 07/10/2021   NA 144 07/10/2021   K 4.5 07/10/2021   CL 102 07/10/2021   CO2 23 07/10/2021   Lab Results  Component Value Date   ALT 6 07/10/2021   AST 12 07/10/2021   ALKPHOS 79 07/10/2021   BILITOT 0.3 07/10/2021   Lab Results  Component Value Date   HGBA1C 5.5 04/12/2014   Lab Results  Component Value Date   INSULIN 10.8 07/10/2021   Lab Results  Component Value Date   TSH 1.120 07/10/2021   Lab Results  Component Value Date   CHOL 196 06/05/2017   HDL 57 06/05/2017   LDLCALC 119 (H) 06/05/2017   TRIG 99 06/05/2017   CHOLHDL 3.4 06/05/2017   Lab Results  Component Value Date   VD25OH 25 (L) 06/05/2017   VD25OH 18.1 (L) 12/01/2015   VD25OH 19.1 (L) 04/12/2014   Lab Results  Component Value Date   WBC 12.7 (H) 11/26/2017   HGB 12.7 11/26/2017   HCT 39.2 11/26/2017   MCV 89.7 11/26/2017   PLT 352 11/26/2017   Attestation Statements:   Reviewed by clinician on day of visit: allergies, medications, problem list, medical history, surgical history, family history, social history, and previous encounter notes.  I, Water quality scientist, CMA, am acting as transcriptionist for Briscoe Deutscher, DO  I have reviewed the above documentation for accuracy and completeness, and I agree with the above. Briscoe Deutscher, DO

## 2021-09-26 ENCOUNTER — Other Ambulatory Visit (HOSPITAL_COMMUNITY): Payer: Self-pay

## 2021-10-23 ENCOUNTER — Encounter (INDEPENDENT_AMBULATORY_CARE_PROVIDER_SITE_OTHER): Payer: Self-pay | Admitting: Family Medicine

## 2021-10-23 ENCOUNTER — Other Ambulatory Visit: Payer: Self-pay

## 2021-10-23 ENCOUNTER — Ambulatory Visit (INDEPENDENT_AMBULATORY_CARE_PROVIDER_SITE_OTHER): Payer: 59 | Admitting: Family Medicine

## 2021-10-23 ENCOUNTER — Other Ambulatory Visit (HOSPITAL_COMMUNITY): Payer: Self-pay

## 2021-10-23 VITALS — BP 98/65 | HR 84 | Temp 97.7°F | Ht 62.0 in | Wt 173.0 lb

## 2021-10-23 DIAGNOSIS — R632 Polyphagia: Secondary | ICD-10-CM

## 2021-10-23 DIAGNOSIS — E88819 Insulin resistance, unspecified: Secondary | ICD-10-CM

## 2021-10-23 DIAGNOSIS — E8881 Metabolic syndrome: Secondary | ICD-10-CM

## 2021-10-23 DIAGNOSIS — Z903 Acquired absence of stomach [part of]: Secondary | ICD-10-CM | POA: Diagnosis not present

## 2021-10-23 DIAGNOSIS — Z6835 Body mass index (BMI) 35.0-35.9, adult: Secondary | ICD-10-CM | POA: Diagnosis not present

## 2021-10-23 MED ORDER — PHENTERMINE HCL 37.5 MG PO TABS
37.5000 mg | ORAL_TABLET | Freq: Every day | ORAL | 0 refills | Status: DC
Start: 2021-10-23 — End: 2021-11-27
  Filled 2021-10-23: qty 30, 30d supply, fill #0

## 2021-10-23 MED ORDER — TIRZEPATIDE 7.5 MG/0.5ML ~~LOC~~ SOAJ
7.5000 mg | SUBCUTANEOUS | 0 refills | Status: DC
  Filled 2021-10-23: qty 6, 84d supply, fill #0

## 2021-10-23 NOTE — Progress Notes (Signed)
Chief Complaint:   OBESITY [name] is here to discuss her progress with her obesity treatment plan along with follow-up of her obesity related diagnoses. See Medical Weight Management Flowsheet for complete bioelectrical impedance results.  Today's visit was #: 5 Starting weight: 195 lbs Starting date: 07/10/2021 Weight change since last visit: 8 lbs Total lbs lost to date: 22 lbs Total weight loss percentage to date: -11.28  Nutrition Plan: Practicing portion control and making smarter food choices, such as increasing vegetables and decreasing simple carbohydrates for 0% of the time. Activity: None. Anti-obesity medications: phentermine 37.5 mg daily and Mounjaro 5 mg subcutaneously weekly. Reported side effects: None.  Interim History: [name] is happy with her medication combination, but endorses increased polyphagia toward the end of the week.  She is doing well with setting healthy boundaries.  Assessment/Plan:   1. Polyphagia Worsening. Current treatment: phentermine 37.5 mg daily.    Plan:  Continue phentermine 37.5 mg daily.  Will refill today. She will continue to focus on protein-rich, low simple carbohydrate foods. We reviewed the importance of hydration, regular exercise for stress reduction, and restorative sleep.  Having again reminded the patient of the "off label" use of Phentermine beyond three consecutive months, and again discussing the risks, benefits, contraindications, and limitations of it's use; given it's role in the successful treatment of obesity thus far and lack of adverse effect, patient has expressed desire and given informed verbal consent to continue use.   I have consulted the [place] Controlled Substances Registry for this patient, and feel the risk/benefit ratio today is favorable for proceeding with this prescription for a controlled substance. The patient understands monitoring parameters and red flags.   2. Insulin resistance Improving, but not  optimized. Goal is HgbA1c < 5.7, fasting insulin closer to 5.  Medication: Mounjaro 5 mg subcutaneously weekly.    Plan:  Increase Mounjaro to 7.5 mg subcutaneously weekly.  She will continue to focus on protein-rich, low simple carbohydrate foods. We reviewed the importance of hydration, regular exercise for stress reduction, and restorative sleep.   Lab Results  Component Value Date   HGBA1C 5.5 04/12/2014   Lab Results  Component Value Date   INSULIN 10.8 07/10/2021   3. History of sleeve gastrectomy [name] is at risk for malnutrition due to her previous bariatric surgery.   Counseling You may need to eat 3 meals and 2 snacks, or 5 small meals each day in order to reach your protein and calorie goals.  Allow at least 15 minutes for each meal so that you can eat mindfully. Listen to your body so that you do not overeat. For most people, your sleeve or pouch will comfortably hold 4-6 ounces. Eat foods from all food groups. This includes fruits and vegetables, grains, dairy, and meat and other proteins. Include a protein-rich food at every meal and snack, and eat the protein food first.  You should be taking a Bariatric Multivitamin as well as calcium.   4. Obesity BMI today is 31  Course: [name] is currently in the action stage of change. As such, her goal is to continue with weight loss efforts.   Nutrition goals: She has agreed to practicing portion control and making smarter food choices, such as increasing vegetables and decreasing simple carbohydrates.   Exercise goals:  As is.  Behavioral modification strategies: increasing lean protein intake, decreasing simple carbohydrates, increasing vegetables, and increasing water intake.  [name] has agreed to follow-up with our clinic in 4 weeks.  She was informed of the importance of frequent follow-up visits to maximize her success with intensive lifestyle modifications for her multiple health conditions.   Objective:   Blood pressure  98/65, pulse 84, temperature 97.7 F (36.5 C), temperature source Oral, height 5\' 2"  (1.575 m), weight 173 lb (78.5 kg), last menstrual period 09/18/2017, SpO2 97 %. Body mass index is 31.64 kg/m.  General: Cooperative, alert, well developed, in no acute distress. HEENT: Conjunctivae and lids unremarkable. Cardiovascular: Regular rhythm.  Lungs: Normal work of breathing. Neurologic: No focal deficits.   Lab Results  Component Value Date   CREATININE 0.63 07/10/2021   BUN 11 07/10/2021   NA 144 07/10/2021   K 4.5 07/10/2021   CL 102 07/10/2021   CO2 23 07/10/2021   Lab Results  Component Value Date   ALT 6 07/10/2021   AST 12 07/10/2021   ALKPHOS 79 07/10/2021   BILITOT 0.3 07/10/2021   Lab Results  Component Value Date   HGBA1C 5.5 04/12/2014   Lab Results  Component Value Date   INSULIN 10.8 07/10/2021   Lab Results  Component Value Date   TSH 1.120 07/10/2021   Lab Results  Component Value Date   CHOL 196 06/05/2017   HDL 57 06/05/2017   LDLCALC 119 (H) 06/05/2017   TRIG 99 06/05/2017   CHOLHDL 3.4 06/05/2017   Lab Results  Component Value Date   VD25OH 25 (L) 06/05/2017   VD25OH 18.1 (L) 12/01/2015   VD25OH 19.1 (L) 04/12/2014   Lab Results  Component Value Date   WBC 12.7 (H) 11/26/2017   HGB 12.7 11/26/2017   HCT 39.2 11/26/2017   MCV 89.7 11/26/2017   PLT 352 11/26/2017   Attestation Statements:   Reviewed by clinician on day of visit: allergies, medications, problem list, medical history, surgical history, family history, social history, and previous encounter notes.  I, Water quality scientist, CMA, am acting as transcriptionist for Briscoe Deutscher, DO  I have reviewed the above documentation for accuracy and completeness, and I agree with the above. -  Briscoe Deutscher, DO, MS, FAAFP, DABOM - Family and Bariatric Medicine.

## 2021-10-24 ENCOUNTER — Other Ambulatory Visit (HOSPITAL_COMMUNITY): Payer: Self-pay

## 2021-10-31 ENCOUNTER — Other Ambulatory Visit (HOSPITAL_COMMUNITY): Payer: Self-pay

## 2021-10-31 DIAGNOSIS — Z87891 Personal history of nicotine dependence: Secondary | ICD-10-CM | POA: Diagnosis not present

## 2021-10-31 DIAGNOSIS — F332 Major depressive disorder, recurrent severe without psychotic features: Secondary | ICD-10-CM | POA: Diagnosis not present

## 2021-10-31 DIAGNOSIS — F419 Anxiety disorder, unspecified: Secondary | ICD-10-CM | POA: Diagnosis not present

## 2021-10-31 DIAGNOSIS — F5101 Primary insomnia: Secondary | ICD-10-CM | POA: Diagnosis not present

## 2021-10-31 MED ORDER — SERTRALINE HCL 50 MG PO TABS
ORAL_TABLET | ORAL | 0 refills | Status: DC
  Filled 2021-10-31: qty 180, 90d supply, fill #0

## 2021-11-03 ENCOUNTER — Other Ambulatory Visit (HOSPITAL_COMMUNITY): Payer: Self-pay

## 2021-11-12 ENCOUNTER — Other Ambulatory Visit (HOSPITAL_COMMUNITY): Payer: Self-pay

## 2021-11-12 MED ORDER — OSELTAMIVIR PHOSPHATE 75 MG PO CAPS
ORAL_CAPSULE | ORAL | 0 refills | Status: DC
  Filled 2021-11-12: qty 10, 10d supply, fill #0

## 2021-11-16 ENCOUNTER — Other Ambulatory Visit (HOSPITAL_COMMUNITY): Payer: Self-pay

## 2021-11-16 MED ORDER — BUPROPION HCL ER (XL) 300 MG PO TB24
ORAL_TABLET | ORAL | 1 refills | Status: DC
  Filled 2021-11-16: qty 90, 90d supply, fill #0
  Filled 2022-03-12: qty 90, 90d supply, fill #1

## 2021-11-27 ENCOUNTER — Encounter (INDEPENDENT_AMBULATORY_CARE_PROVIDER_SITE_OTHER): Payer: Self-pay | Admitting: Family Medicine

## 2021-11-27 ENCOUNTER — Other Ambulatory Visit (HOSPITAL_COMMUNITY): Payer: Self-pay

## 2021-11-27 ENCOUNTER — Other Ambulatory Visit: Payer: Self-pay

## 2021-11-27 ENCOUNTER — Ambulatory Visit (INDEPENDENT_AMBULATORY_CARE_PROVIDER_SITE_OTHER): Payer: 59 | Admitting: Family Medicine

## 2021-11-27 VITALS — BP 106/70 | HR 94 | Temp 97.7°F | Ht 62.0 in | Wt 158.0 lb

## 2021-11-27 DIAGNOSIS — E559 Vitamin D deficiency, unspecified: Secondary | ICD-10-CM | POA: Diagnosis not present

## 2021-11-27 DIAGNOSIS — Z6835 Body mass index (BMI) 35.0-35.9, adult: Secondary | ICD-10-CM | POA: Diagnosis not present

## 2021-11-27 DIAGNOSIS — E8881 Metabolic syndrome: Secondary | ICD-10-CM | POA: Diagnosis not present

## 2021-11-27 DIAGNOSIS — R632 Polyphagia: Secondary | ICD-10-CM

## 2021-11-27 DIAGNOSIS — E88819 Insulin resistance, unspecified: Secondary | ICD-10-CM

## 2021-11-27 MED ORDER — PHENTERMINE HCL 37.5 MG PO TABS
37.5000 mg | ORAL_TABLET | Freq: Every day | ORAL | 0 refills | Status: DC
Start: 2021-11-27 — End: 2022-03-05
  Filled 2021-11-27: qty 90, 90d supply, fill #0

## 2021-11-27 NOTE — Progress Notes (Signed)
Chief Complaint:   OBESITY [name] is here to discuss her progress with her obesity treatment plan along with follow-up of her obesity related diagnoses. See Medical Weight Management Flowsheet for complete bioelectrical impedance results.  Today's visit was #: 6 Starting weight: 195 lbs Starting date: 07/10/2021 Weight change since last visit: 15 lbs Total lbs lost to date: 37 lbs Total weight loss percentage to date: -18.97%  Nutrition Plan: Practicing portion control and making smarter food choices, such as increasing vegetables and decreasing simple carbohydrates for 95% of the time. Activity: None. Anti-obesity medications: Mounjaro 7.5 mg subcutaneously weekly and phentermine 37.5 mg daily. Reported side effects: None.  Interim History: [name]'s weight goal is 120 pounds.  She says she is happy with her plan and progress.  She is doing well with healthy food choices/mindful eating.  Assessment/Plan:   1. Polyphagia Controlled. Current treatment: phentermine 37.5 mg daily.    Plan:  Continue phentermine 37.5 mg daily.  Will refill today. She will continue to focus on protein-rich, low simple carbohydrate foods. We reviewed the importance of hydration, regular exercise for stress reduction, and restorative sleep.  - Refill phentermine (ADIPEX-P) 37.5 MG tablet; Take 1 tablet by mouth daily.  Dispense: 90 tablet; Refill: 0  Having again reminded the patient of the "off label" use of Phentermine beyond three consecutive months, and again discussing the risks, benefits, contraindications, and limitations of it's use; given it's role in the successful treatment of obesity thus far and lack of adverse effect, patient has expressed desire and given informed verbal consent to continue use.   I have consulted the [place] Controlled Substances Registry for this patient, and feel the risk/benefit ratio today is favorable for proceeding with this prescription for a controlled substance. The  patient understands monitoring parameters and red flags.   2. Insulin resistance Not optimized. Goal is HgbA1c < 5.7, fasting insulin closer to 5.  Medication: Mounjaro 7.5 mg subcutaneously weekly.    Plan: Continue Mounjaro 7.5 mg subcutaneously weekly.  She will continue to focus on protein-rich, low simple carbohydrate foods. We reviewed the importance of hydration, regular exercise for stress reduction, and restorative sleep.   Lab Results  Component Value Date   HGBA1C 5.5 04/12/2014   Lab Results  Component Value Date   INSULIN 10.8 07/10/2021   3. Vitamin D deficiency Not at goal.   Plan: Continue current OTC vitamin D supplementation.  Follow-up for routine testing of Vitamin D, at least 2-3 times per year to avoid over-replacement.  Lab Results  Component Value Date   VD25OH 25 (L) 06/05/2017   VD25OH 18.1 (L) 12/01/2015   VD25OH 19.1 (L) 04/12/2014   4. Obesity BMI today is 29  Course: [name] is currently in the action stage of change. As such, her goal is to continue with weight loss efforts.   Nutrition goals: She has agreed to practicing portion control and making smarter food choices, such as increasing vegetables and decreasing simple carbohydrates.   Exercise goals:  Increase NEAT.  Behavioral modification strategies: increasing lean protein intake, decreasing simple carbohydrates, increasing vegetables, and increasing water intake.  [name] has agreed to follow-up with our clinic in 4 weeks. She was informed of the importance of frequent follow-up visits to maximize her success with intensive lifestyle modifications for her multiple health conditions.   Objective:   Blood pressure 106/70, pulse 94, temperature 97.7 F (36.5 C), temperature source Oral, height 5\' 2"  (1.575 m), weight 158 lb (71.7 kg), last  menstrual period 09/18/2017, SpO2 97 %. Body mass index is 28.9 kg/m.  General: Cooperative, alert, well developed, in no acute distress. HEENT:  Conjunctivae and lids unremarkable. Cardiovascular: Regular rhythm.  Lungs: Normal work of breathing. Neurologic: No focal deficits.   Lab Results  Component Value Date   CREATININE 0.63 07/10/2021   BUN 11 07/10/2021   NA 144 07/10/2021   K 4.5 07/10/2021   CL 102 07/10/2021   CO2 23 07/10/2021   Lab Results  Component Value Date   ALT 6 07/10/2021   AST 12 07/10/2021   ALKPHOS 79 07/10/2021   BILITOT 0.3 07/10/2021   Lab Results  Component Value Date   HGBA1C 5.5 04/12/2014   Lab Results  Component Value Date   INSULIN 10.8 07/10/2021   Lab Results  Component Value Date   TSH 1.120 07/10/2021   Lab Results  Component Value Date   CHOL 196 06/05/2017   HDL 57 06/05/2017   LDLCALC 119 (H) 06/05/2017   TRIG 99 06/05/2017   CHOLHDL 3.4 06/05/2017   Lab Results  Component Value Date   VD25OH 25 (L) 06/05/2017   VD25OH 18.1 (L) 12/01/2015   VD25OH 19.1 (L) 04/12/2014   Lab Results  Component Value Date   WBC 12.7 (H) 11/26/2017   HGB 12.7 11/26/2017   HCT 39.2 11/26/2017   MCV 89.7 11/26/2017   PLT 352 11/26/2017   Attestation Statements:   Reviewed by clinician on day of visit: allergies, medications, problem list, medical history, surgical history, family history, social history, and previous encounter notes.  I, Water quality scientist, CMA, am acting as transcriptionist for Briscoe Deutscher, DO  I have reviewed the above documentation for accuracy and completeness, and I agree with the above. -  Briscoe Deutscher, DO, MS, FAAFP, DABOM - Family and Bariatric Medicine.

## 2021-12-02 ENCOUNTER — Encounter (INDEPENDENT_AMBULATORY_CARE_PROVIDER_SITE_OTHER): Payer: Self-pay | Admitting: Family Medicine

## 2022-01-08 ENCOUNTER — Other Ambulatory Visit: Payer: Self-pay

## 2022-01-08 ENCOUNTER — Other Ambulatory Visit (HOSPITAL_COMMUNITY): Payer: Self-pay

## 2022-01-08 ENCOUNTER — Ambulatory Visit (INDEPENDENT_AMBULATORY_CARE_PROVIDER_SITE_OTHER): Payer: 59 | Admitting: Family Medicine

## 2022-01-08 ENCOUNTER — Encounter (INDEPENDENT_AMBULATORY_CARE_PROVIDER_SITE_OTHER): Payer: Self-pay | Admitting: Family Medicine

## 2022-01-08 VITALS — BP 113/74 | HR 65 | Temp 97.7°F | Ht 62.0 in | Wt 147.0 lb

## 2022-01-08 DIAGNOSIS — R632 Polyphagia: Secondary | ICD-10-CM

## 2022-01-08 DIAGNOSIS — E669 Obesity, unspecified: Secondary | ICD-10-CM

## 2022-01-08 DIAGNOSIS — E8881 Metabolic syndrome: Secondary | ICD-10-CM

## 2022-01-08 DIAGNOSIS — F3289 Other specified depressive episodes: Secondary | ICD-10-CM | POA: Diagnosis not present

## 2022-01-08 DIAGNOSIS — Z903 Acquired absence of stomach [part of]: Secondary | ICD-10-CM

## 2022-01-08 DIAGNOSIS — Z6827 Body mass index (BMI) 27.0-27.9, adult: Secondary | ICD-10-CM

## 2022-01-08 MED ORDER — TIRZEPATIDE 7.5 MG/0.5ML ~~LOC~~ SOAJ
7.5000 mg | SUBCUTANEOUS | 0 refills | Status: DC
  Filled 2022-01-08: qty 6, 84d supply, fill #0

## 2022-01-09 ENCOUNTER — Other Ambulatory Visit (HOSPITAL_COMMUNITY): Payer: Self-pay

## 2022-01-10 NOTE — Progress Notes (Signed)
Chief Complaint:   OBESITY [name] is here to discuss her progress with her obesity treatment plan along with follow-up of her obesity related diagnoses. See Medical Weight Management Flowsheet for complete bioelectrical impedance results.  Today's visit was #: 7 Starting weight: 195 lbs Starting date: 07/10/2021 Weight change since last visit: 11 lbs Total lbs lost to date: 48 lbs Total weight loss percentage to date: -24.62%  Nutrition Plan: Practicing portion control and making smarter food choices, such as increasing vegetables and decreasing simple carbohydrates for 0% of the time.  Activity: None. Anti-obesity medications: Mounjaro 7.5 mg subcutaneously weekly and phentermine 37.5 mg daily. Reported side effects: None.  Interim History: [name] says she has 14 weeks left in grad school.  She is down 48 pounds today!  Her highest weight was 224 pounds.  She says she is down to a size 9.  Assessment/Plan:   1. Insulin resistance At goal. Goal is HgbA1c < 5.7, fasting insulin closer to 5.  Medication: Mounjaro 7.5 mg subcutaneously weekly.    Plan:  Continue Mounjaro 7.5 mg subcutaneously weekly.  She will continue to focus on protein-rich, low simple carbohydrate foods. We reviewed the importance of hydration, regular exercise for stress reduction, and restorative sleep.   Lab Results  Component Value Date   HGBA1C 5.5 04/12/2014   Lab Results  Component Value Date   INSULIN 10.8 07/10/2021   - Refill tirzepatide (MOUNJARO) 7.5 MG/0.5ML Pen; Inject 7.5 mg into the skin once a week.  Dispense: 6 mL; Refill: 0  2. Polyphagia Controlled. Current treatment: phentermine 37.5 mg daily.    Plan: She will continue to focus on protein-rich, low simple carbohydrate foods. We reviewed the importance of hydration, regular exercise for stress reduction, and restorative sleep.  3. History of sleeve gastrectomy [name] is at risk for malnutrition due to her previous bariatric surgery.    Counseling You may need to eat 3 meals and 2 snacks, or 5 small meals each day in order to reach your protein and calorie goals.  Allow at least 15 minutes for each meal so that you can eat mindfully. Listen to your body so that you do not overeat. For most people, your sleeve or pouch will comfortably hold 4-6 ounces. Eat foods from all food groups. This includes fruits and vegetables, grains, dairy, and meat and other proteins. Include a protein-rich food at every meal and snack, and eat the protein food first.  You should be taking a Bariatric Multivitamin as well as calcium.   4. Other depression, with emotional eating Controlled. Medication: Wellbutrin XL 300 mg daily.  Currently weaning Zoloft and is now down to 100 mg daily.   Plan:  Discussed cues and consequences, how thoughts affect eating, model of thoughts, feelings, and behaviors, and strategies for change by focusing on the cue. Discussed cognitive distortions, coping thoughts, and how to change your thoughts.  5. Obesity BMI today is 27  Course: [name] is currently in the action stage of change. As such, her goal is to continue with weight loss efforts.   Nutrition goals: She has agreed to practicing portion control and making smarter food choices, such as increasing vegetables and decreasing simple carbohydrates.   Exercise goals:  Increase NEAT.  Behavioral modification strategies: increasing lean protein intake, decreasing simple carbohydrates, increasing vegetables, increasing water intake, and decreasing liquid calories.  [name] has agreed to follow-up with our clinic in 4 weeks. She was informed of the importance of frequent follow-up visits  to maximize her success with intensive lifestyle modifications for her multiple health conditions.   Objective:   Blood pressure 113/74, pulse 65, temperature 97.7 F (36.5 C), temperature source Oral, height 5\' 2"  (1.575 m), weight 147 lb (66.7 kg), last menstrual period  09/18/2017, SpO2 98 %. Body mass index is 26.89 kg/m.  General: Cooperative, alert, well developed, in no acute distress. HEENT: Conjunctivae and lids unremarkable. Cardiovascular: Regular rhythm.  Lungs: Normal work of breathing. Neurologic: No focal deficits.   Lab Results  Component Value Date   CREATININE 0.63 07/10/2021   BUN 11 07/10/2021   NA 144 07/10/2021   K 4.5 07/10/2021   CL 102 07/10/2021   CO2 23 07/10/2021   Lab Results  Component Value Date   ALT 6 07/10/2021   AST 12 07/10/2021   ALKPHOS 79 07/10/2021   BILITOT 0.3 07/10/2021   Lab Results  Component Value Date   HGBA1C 5.5 04/12/2014   Lab Results  Component Value Date   INSULIN 10.8 07/10/2021   Lab Results  Component Value Date   TSH 1.120 07/10/2021   Lab Results  Component Value Date   CHOL 196 06/05/2017   HDL 57 06/05/2017   LDLCALC 119 (H) 06/05/2017   TRIG 99 06/05/2017   CHOLHDL 3.4 06/05/2017   Lab Results  Component Value Date   VD25OH 25 (L) 06/05/2017   VD25OH 18.1 (L) 12/01/2015   VD25OH 19.1 (L) 04/12/2014   Lab Results  Component Value Date   WBC 12.7 (H) 11/26/2017   HGB 12.7 11/26/2017   HCT 39.2 11/26/2017   MCV 89.7 11/26/2017   PLT 352 11/26/2017   Attestation Statements:   Reviewed by clinician on day of visit: allergies, medications, problem list, medical history, surgical history, family history, social history, and previous encounter notes.  I, Water quality scientist, CMA, am acting as transcriptionist for Briscoe Deutscher, DO  I have reviewed the above documentation for accuracy and completeness, and I agree with the above. -  Briscoe Deutscher, DO, MS, FAAFP, DABOM - Family and Bariatric Medicine.

## 2022-01-16 ENCOUNTER — Other Ambulatory Visit (HOSPITAL_COMMUNITY): Payer: Self-pay

## 2022-01-16 MED ORDER — ALPRAZOLAM 0.5 MG PO TABS
ORAL_TABLET | ORAL | 0 refills | Status: DC
  Filled 2022-01-16: qty 180, 90d supply, fill #0

## 2022-01-22 ENCOUNTER — Other Ambulatory Visit (HOSPITAL_COMMUNITY): Payer: Self-pay

## 2022-01-23 ENCOUNTER — Other Ambulatory Visit (HOSPITAL_COMMUNITY): Payer: Self-pay

## 2022-01-23 MED ORDER — ZOLPIDEM TARTRATE 10 MG PO TABS
ORAL_TABLET | ORAL | 0 refills | Status: DC
  Filled 2022-01-23: qty 90, 90d supply, fill #0

## 2022-03-05 ENCOUNTER — Encounter (INDEPENDENT_AMBULATORY_CARE_PROVIDER_SITE_OTHER): Payer: Self-pay | Admitting: Family Medicine

## 2022-03-05 ENCOUNTER — Ambulatory Visit (INDEPENDENT_AMBULATORY_CARE_PROVIDER_SITE_OTHER): Payer: 59 | Admitting: Family Medicine

## 2022-03-05 ENCOUNTER — Other Ambulatory Visit: Payer: Self-pay

## 2022-03-05 ENCOUNTER — Other Ambulatory Visit (HOSPITAL_COMMUNITY): Payer: Self-pay

## 2022-03-05 VITALS — BP 108/74 | HR 64 | Temp 98.3°F | Ht 62.0 in | Wt 135.0 lb

## 2022-03-05 DIAGNOSIS — F418 Other specified anxiety disorders: Secondary | ICD-10-CM

## 2022-03-05 DIAGNOSIS — Z6824 Body mass index (BMI) 24.0-24.9, adult: Secondary | ICD-10-CM | POA: Diagnosis not present

## 2022-03-05 DIAGNOSIS — R632 Polyphagia: Secondary | ICD-10-CM

## 2022-03-05 DIAGNOSIS — E669 Obesity, unspecified: Secondary | ICD-10-CM | POA: Diagnosis not present

## 2022-03-05 DIAGNOSIS — K5903 Drug induced constipation: Secondary | ICD-10-CM | POA: Diagnosis not present

## 2022-03-05 MED ORDER — PHENTERMINE HCL 37.5 MG PO TABS
37.5000 mg | ORAL_TABLET | Freq: Every day | ORAL | 0 refills | Status: DC
  Filled 2022-03-05: qty 90, 90d supply, fill #0

## 2022-03-05 MED ORDER — TIRZEPATIDE 5 MG/0.5ML ~~LOC~~ SOAJ
5.0000 mg | SUBCUTANEOUS | 2 refills | Status: DC
  Filled 2022-03-05: qty 6, 84d supply, fill #0
  Filled 2022-05-09 – 2022-05-20 (×3): qty 6, 84d supply, fill #1
  Filled 2022-05-28: qty 2, 28d supply, fill #1
  Filled 2022-06-25: qty 6, 84d supply, fill #1

## 2022-03-05 MED ORDER — LINACLOTIDE 72 MCG PO CAPS
72.0000 ug | ORAL_CAPSULE | Freq: Every day | ORAL | 0 refills | Status: DC | PRN
  Filled 2022-03-05: qty 30, 30d supply, fill #0

## 2022-03-12 ENCOUNTER — Other Ambulatory Visit (HOSPITAL_COMMUNITY): Payer: Self-pay

## 2022-03-12 ENCOUNTER — Ambulatory Visit: Payer: 59 | Admitting: Orthopedic Surgery

## 2022-03-12 ENCOUNTER — Encounter: Payer: Self-pay | Admitting: Orthopedic Surgery

## 2022-03-12 ENCOUNTER — Other Ambulatory Visit: Payer: Self-pay

## 2022-03-12 VITALS — Ht 62.0 in | Wt 138.0 lb

## 2022-03-12 DIAGNOSIS — M25561 Pain in right knee: Secondary | ICD-10-CM | POA: Diagnosis not present

## 2022-03-12 DIAGNOSIS — G8929 Other chronic pain: Secondary | ICD-10-CM

## 2022-03-12 NOTE — Patient Instructions (Signed)

## 2022-03-12 NOTE — Progress Notes (Signed)
New Patient Visit ? ?Assessment: ?KIMYATTA LECY is a 51 y.o. female with the following: ?Right knee pain; MRI documented anterior horn lateral meniscus tear, with medial based knee pain ? ?Plan: ?Mrs. Chuba has developed some progressively worsening right knee pain over the last couple of weeks.  She had an excellent response from a prior injection, completed by Dr. Luna Glasgow.  She would like to proceed with another injection today.  This was completed without issues.  If she continues to have any problems with her right knee, she can return to clinic for repeat evaluation.  Medications as needed.  Follow-up as needed. ? ?Procedure note injection Right knee joint ?  ?Verbal consent was obtained to inject the right knee joint  ?Timeout was completed to confirm the site of injection.  The skin was prepped with alcohol and ethyl chloride was sprayed at the injection site.  ?A 21-gauge needle was used to inject 40 mg of Depo-Medrol and 1% lidocaine (3 cc) into the right knee using an anterolateral approach.  ?There were no complications. A sterile bandage was applied. ? ? ? ?Follow-up: ?Return if symptoms worsen or fail to improve. ? ?Subjective: ? ?Chief Complaint  ?Patient presents with  ? Knee Pain  ?  Rt knee pain, would like a repeat injection  ? ? ?History of Present Illness: ?KARRA PINK is a 50 y.o. female who presents for repeat evaluation of right knee pain.  I saw her several months ago in consultation following a right knee MRI.  Around that time, she had a right knee injection, which has provided excellent relief.  She has noted some progressively worsening pain recently.  She is interested in a repeat injection.  She states she does not want to have surgery.  She is not interested in medications. ? ?Review of Systems: ?No fevers or chills ?No numbness or tingling ?No chest pain ?No shortness of breath ?No bowel or bladder dysfunction ?No GI distress ?No headaches ? ? ? ? ?Objective: ?Ht '5\' 2"'$  (1.575 m)    Wt 138 lb (62.6 kg)   LMP 09/18/2017   BMI 25.24 kg/m?  ? ?Physical Exam: ? ?General: Alert and oriented. and No acute distress. ?Gait: Right sided antalgic gait. ? ?Right knee without obvious effusion.  Well-healed surgical incisions.  She tolerates range of motion from full extension, to 110 degrees of flexion.  Negative Lachman.  No increased laxity to varus or valgus stress.  Tenderness to palpation over the medial and lateral joint line. ? ? ?IMAGING: ?I personally reviewed images previously obtained in clinic ? ?No new imaging obtained today. ? ?New Medications:  ?No orders of the defined types were placed in this encounter. ? ? ? ? ? ?Mordecai Rasmussen, MD ? ?03/12/2022 ?2:55 PM ? ? ? ?

## 2022-03-13 NOTE — Progress Notes (Signed)
Chief Complaint:   OBESITY [name] is here to discuss her progress with her obesity treatment plan along with follow-up of her obesity related diagnoses. See Medical Weight Management Flowsheet for complete bioelectrical impedance results.  Today's visit was #: 8 Starting weight: 195 lbs Starting date: 7/26/222 Weight change since last visit: 12 lbs Total lbs lost to date: 60 lbs Total weight loss percentage to date: -30.77%  Nutrition Plan: Practicing portion control and making smarter food choices, such as increasing vegetables and decreasing simple carbohydrates for 0% of the time. Activity: None. Anti-obesity medications: Mounjaro 7.5 mg .sucu weekly and phentermine 37.5 mg daily. Reported side effects: None.  Interim History: [name] says she has been focused on protein.  She is going 10 days between injections and think she is okay with decreasing to 5 mg.  She is taking a regular vitamin.  Assessment/Plan:   1. Drug induced constipation Status post sleeve gastrectomy.  [name] takes Senna daily.  Will add Linzess 72 mcg daily to her regimen.  - New. linaclotide (LINZESS) 72 MCG capsule; Take 1 - 3 capsules by mouth daily as needed.  Dispense: 30 capsule; Refill: 0  2. Polyphagia Controlled. Current treatment: phentermine 37.5 mg daily and Mounjaro 7.5 mg daily.    Plan: Continue phentermine 37.5 mg daily and Mounjaro 5 mg subcutaneously weekly.  She will continue to focus on protein-rich, low simple carbohydrate foods. We reviewed the importance of hydration, regular exercise for stress reduction, and restorative sleep.  - Refill phentermine (ADIPEX-P) 37.5 MG tablet; Take 1 tablet by mouth daily.  Dispense: 90 tablet; Refill: 0 - Decrease tirzepatide (MOUNJARO) 5 MG/0.5ML Pen; Inject 5 mg into the skin once a week.  Dispense: 6 mL; Refill: 2  Having again reminded the patient of the "off label" use of Phentermine beyond three consecutive months, and again discussing the  risks, benefits, contraindications, and limitations of it's use; given it's role in the successful treatment of obesity thus far and lack of adverse effect, patient has expressed desire and given informed verbal consent to continue use.   I have consulted the [place] Controlled Substances Registry for this patient, and feel the risk/benefit ratio today is favorable for proceeding with this prescription for a controlled substance. The patient understands monitoring parameters and red flags.   3. Situational anxiety Behavior modification techniques were discussed today to help deal with emotional/non-hunger eating behaviors. We will continue to monitor as it relates to her weight loss journey.  4. Obesity BMI today is 24.7  Course: [name] is currently in the action stage of change. As such, her goal is to continue with weight loss efforts.   Nutrition goals: She has agreed to practicing portion control and making smarter food choices, such as increasing vegetables and decreasing simple carbohydrates.   Exercise goals:  As is.  Behavioral modification strategies: increasing lean protein intake, decreasing simple carbohydrates, increasing vegetables, and increasing water intake.  [name] has agreed to follow-up with our clinic in 4 weeks. She was informed of the importance of frequent follow-up visits to maximize her success with intensive lifestyle modifications for her multiple health conditions.   Objective:   Blood pressure 108/74, pulse 64, temperature 98.3 F (36.8 C), height '5\' 2"'$  (1.575 m), weight 135 lb (61.2 kg), last menstrual period 09/18/2017, SpO2 96 %. Body mass index is 24.69 kg/m.  General: Cooperative, alert, well developed, in no acute distress. HEENT: Conjunctivae and lids unremarkable. Cardiovascular: Regular rhythm.  Lungs: Normal work of breathing.  Neurologic: No focal deficits.   Lab Results  Component Value Date   CREATININE 0.63 07/10/2021   BUN 11 07/10/2021   NA  144 07/10/2021   K 4.5 07/10/2021   CL 102 07/10/2021   CO2 23 07/10/2021   Lab Results  Component Value Date   ALT 6 07/10/2021   AST 12 07/10/2021   ALKPHOS 79 07/10/2021   BILITOT 0.3 07/10/2021   Lab Results  Component Value Date   HGBA1C 5.5 04/12/2014   Lab Results  Component Value Date   INSULIN 10.8 07/10/2021   Lab Results  Component Value Date   TSH 1.120 07/10/2021   Lab Results  Component Value Date   CHOL 196 06/05/2017   HDL 57 06/05/2017   LDLCALC 119 (H) 06/05/2017   TRIG 99 06/05/2017   CHOLHDL 3.4 06/05/2017   Lab Results  Component Value Date   VD25OH 25 (L) 06/05/2017   VD25OH 18.1 (L) 12/01/2015   VD25OH 19.1 (L) 04/12/2014   Lab Results  Component Value Date   WBC 12.7 (H) 11/26/2017   HGB 12.7 11/26/2017   HCT 39.2 11/26/2017   MCV 89.7 11/26/2017   PLT 352 11/26/2017   Attestation Statements:   Reviewed by clinician on day of visit: allergies, medications, problem list, medical history, surgical history, family history, social history, and previous encounter notes.  I, Water quality scientist, CMA, am acting as transcriptionist for Briscoe Deutscher, DO  I have reviewed the above documentation for accuracy and completeness, and I agree with the above. -  Briscoe Deutscher, DO, MS, FAAFP, DABOM - Family and Bariatric Medicine.

## 2022-03-14 ENCOUNTER — Other Ambulatory Visit (HOSPITAL_COMMUNITY): Payer: Self-pay

## 2022-03-14 DIAGNOSIS — Z0000 Encounter for general adult medical examination without abnormal findings: Secondary | ICD-10-CM | POA: Diagnosis not present

## 2022-03-14 DIAGNOSIS — E782 Mixed hyperlipidemia: Secondary | ICD-10-CM | POA: Diagnosis not present

## 2022-03-14 DIAGNOSIS — F5101 Primary insomnia: Secondary | ICD-10-CM | POA: Diagnosis not present

## 2022-03-14 DIAGNOSIS — F3341 Major depressive disorder, recurrent, in partial remission: Secondary | ICD-10-CM | POA: Diagnosis not present

## 2022-03-14 DIAGNOSIS — Z1211 Encounter for screening for malignant neoplasm of colon: Secondary | ICD-10-CM | POA: Diagnosis not present

## 2022-03-14 DIAGNOSIS — Z9884 Bariatric surgery status: Secondary | ICD-10-CM | POA: Diagnosis not present

## 2022-03-14 DIAGNOSIS — F419 Anxiety disorder, unspecified: Secondary | ICD-10-CM | POA: Diagnosis not present

## 2022-03-14 MED ORDER — ALPRAZOLAM 0.5 MG PO TABS
ORAL_TABLET | ORAL | 0 refills | Status: DC
  Filled 2022-03-14 – 2022-06-11 (×2): qty 180, 90d supply, fill #0

## 2022-03-14 MED ORDER — SERTRALINE HCL 50 MG PO TABS
ORAL_TABLET | ORAL | 1 refills | Status: DC
  Filled 2022-03-14: qty 90, 90d supply, fill #0
  Filled 2022-06-11: qty 90, 90d supply, fill #1

## 2022-03-14 MED ORDER — ZOLPIDEM TARTRATE 10 MG PO TABS
ORAL_TABLET | ORAL | 1 refills | Status: DC
  Filled 2022-03-14 – 2022-04-19 (×4): qty 90, 90d supply, fill #0
  Filled 2022-06-11 – 2022-07-18 (×3): qty 90, 90d supply, fill #1

## 2022-03-14 MED ORDER — BUPROPION HCL ER (XL) 300 MG PO TB24
ORAL_TABLET | ORAL | 1 refills | Status: DC
  Filled 2022-03-14 – 2022-06-11 (×2): qty 90, 90d supply, fill #0
  Filled 2022-06-25 – 2022-10-23 (×2): qty 90, 90d supply, fill #1

## 2022-03-19 ENCOUNTER — Ambulatory Visit: Payer: 59 | Admitting: Orthopedic Surgery

## 2022-04-02 ENCOUNTER — Encounter: Payer: Self-pay | Admitting: *Deleted

## 2022-04-16 ENCOUNTER — Other Ambulatory Visit (HOSPITAL_COMMUNITY): Payer: Self-pay

## 2022-04-16 ENCOUNTER — Other Ambulatory Visit (INDEPENDENT_AMBULATORY_CARE_PROVIDER_SITE_OTHER): Payer: Self-pay | Admitting: Family Medicine

## 2022-04-16 DIAGNOSIS — K5903 Drug induced constipation: Secondary | ICD-10-CM

## 2022-04-18 ENCOUNTER — Other Ambulatory Visit (HOSPITAL_COMMUNITY): Payer: Self-pay

## 2022-04-19 ENCOUNTER — Other Ambulatory Visit (HOSPITAL_COMMUNITY): Payer: Self-pay

## 2022-04-23 DIAGNOSIS — Z8639 Personal history of other endocrine, nutritional and metabolic disease: Secondary | ICD-10-CM | POA: Diagnosis not present

## 2022-04-23 DIAGNOSIS — Z9189 Other specified personal risk factors, not elsewhere classified: Secondary | ICD-10-CM | POA: Diagnosis not present

## 2022-04-23 DIAGNOSIS — G4700 Insomnia, unspecified: Secondary | ICD-10-CM | POA: Diagnosis not present

## 2022-04-23 DIAGNOSIS — Z6823 Body mass index (BMI) 23.0-23.9, adult: Secondary | ICD-10-CM | POA: Diagnosis not present

## 2022-04-23 DIAGNOSIS — R632 Polyphagia: Secondary | ICD-10-CM | POA: Diagnosis not present

## 2022-04-23 DIAGNOSIS — Z903 Acquired absence of stomach [part of]: Secondary | ICD-10-CM | POA: Diagnosis not present

## 2022-04-23 DIAGNOSIS — F331 Major depressive disorder, recurrent, moderate: Secondary | ICD-10-CM | POA: Diagnosis not present

## 2022-04-24 ENCOUNTER — Other Ambulatory Visit (HOSPITAL_COMMUNITY): Payer: Self-pay

## 2022-04-24 MED ORDER — PHENTERMINE HCL 37.5 MG PO TABS
ORAL_TABLET | ORAL | 0 refills | Status: DC
  Filled 2022-04-24 – 2022-05-20 (×2): qty 30, 30d supply, fill #0

## 2022-04-24 MED ORDER — WEGOVY 1 MG/0.5ML ~~LOC~~ SOAJ
SUBCUTANEOUS | 0 refills | Status: DC
  Filled 2022-04-24 – 2022-05-20 (×2): qty 2, 28d supply, fill #0

## 2022-04-24 MED ORDER — LINZESS 72 MCG PO CAPS
ORAL_CAPSULE | ORAL | 0 refills | Status: DC
  Filled 2022-04-24: qty 30, 30d supply, fill #0

## 2022-05-06 ENCOUNTER — Other Ambulatory Visit (HOSPITAL_COMMUNITY): Payer: Self-pay

## 2022-05-06 MED ORDER — WEGOVY 1 MG/0.5ML ~~LOC~~ SOAJ
SUBCUTANEOUS | 0 refills | Status: DC
  Filled 2022-05-08: qty 2, 28d supply, fill #0

## 2022-05-09 ENCOUNTER — Other Ambulatory Visit (HOSPITAL_COMMUNITY): Payer: Self-pay

## 2022-05-20 ENCOUNTER — Other Ambulatory Visit (HOSPITAL_COMMUNITY): Payer: Self-pay

## 2022-05-21 ENCOUNTER — Other Ambulatory Visit (HOSPITAL_COMMUNITY): Payer: Self-pay

## 2022-05-22 ENCOUNTER — Encounter: Payer: Self-pay | Admitting: *Deleted

## 2022-05-23 DIAGNOSIS — Z8639 Personal history of other endocrine, nutritional and metabolic disease: Secondary | ICD-10-CM | POA: Diagnosis not present

## 2022-05-23 DIAGNOSIS — Z6823 Body mass index (BMI) 23.0-23.9, adult: Secondary | ICD-10-CM | POA: Diagnosis not present

## 2022-05-23 DIAGNOSIS — K5909 Other constipation: Secondary | ICD-10-CM | POA: Diagnosis not present

## 2022-05-27 ENCOUNTER — Other Ambulatory Visit (HOSPITAL_COMMUNITY): Payer: Self-pay

## 2022-05-27 ENCOUNTER — Other Ambulatory Visit (HOSPITAL_COMMUNITY): Payer: Self-pay | Admitting: Family Medicine

## 2022-05-27 DIAGNOSIS — Z1231 Encounter for screening mammogram for malignant neoplasm of breast: Secondary | ICD-10-CM

## 2022-05-27 MED ORDER — LINZESS 72 MCG PO CAPS
ORAL_CAPSULE | ORAL | 0 refills | Status: DC
  Filled 2022-05-27: qty 30, 30d supply, fill #0

## 2022-05-28 ENCOUNTER — Other Ambulatory Visit (HOSPITAL_COMMUNITY): Payer: Self-pay

## 2022-06-03 ENCOUNTER — Ambulatory Visit (HOSPITAL_COMMUNITY)
Admission: RE | Admit: 2022-06-03 | Discharge: 2022-06-03 | Disposition: A | Discharge status: Home / Self Care | Payer: 59 | Source: Ambulatory Visit | Attending: Family Medicine | Admitting: Family Medicine

## 2022-06-03 DIAGNOSIS — Z1231 Encounter for screening mammogram for malignant neoplasm of breast: Secondary | ICD-10-CM | POA: Diagnosis not present

## 2022-06-04 ENCOUNTER — Other Ambulatory Visit (HOSPITAL_COMMUNITY): Payer: Self-pay | Admitting: Family Medicine

## 2022-06-06 ENCOUNTER — Other Ambulatory Visit (HOSPITAL_COMMUNITY): Payer: Self-pay | Admitting: Family Medicine

## 2022-06-06 ENCOUNTER — Encounter: Payer: Self-pay | Admitting: *Deleted

## 2022-06-06 DIAGNOSIS — R928 Other abnormal and inconclusive findings on diagnostic imaging of breast: Secondary | ICD-10-CM

## 2022-06-06 NOTE — Patient Instructions (Signed)
Referring MD/PCP: Caren Macadam  Procedure: Colonoscopy  Has patient had this procedure before?  no  If so, when, by whom and where?    Is there a family history of colon cancer?  no  Who?  What age when diagnosed?    Is patient diabetic? If yes, Type 1 or Type 2   no      Does patient have prosthetic heart valve or mechanical valve?  no  Do you have a pacemaker/defibrillator?  no  Has patient ever had endocarditis/atrial fibrillation? no  Does patient use oxygen? no  Has patient had joint replacement within last 12 months?  no  Is patient constipated or do they take laxatives? no  Does patient have a history of alcohol/drug use?  Yes social drinking  Have you had a stroke/heart attack last 6 mths? no  Do you take medicine for weight loss?  yes  For female patients,: have you had a hysterectomy no                      are you post menopausal yes                      do you still have your menstrual cycle no  Is patient on blood thinner such as Coumadin, Plavix and/or Aspirin? no  Medications:  Current Outpatient Medications on File Prior to Visit  Medication Sig Dispense Refill   Acetaminophen (TYLENOL PO) Take 1,000 mg by mouth 3 (three) times daily as needed.     ALPRAZolam (XANAX) 0.5 MG tablet Take 1 tablet (0.5 mg total) by mouth 2 (two) times daily as needed. 180 tablet 0   ALPRAZolam (XANAX) 0.5 MG tablet Take 1 tablet by mouth twice a day as needed. 180 tablet 0   buPROPion (WELLBUTRIN XL) 300 MG 24 hr tablet Take 1 tablet by mouth once in the morning 90 tablet 1   CALCIUM-VITAMIN D PO Take by mouth.     Cyanocobalamin (VITAMIN B-12 PO) Take 2 each by mouth daily at 12 noon.     levocetirizine (XYZAL) 5 MG tablet Take 1 tablet (5 mg total) by mouth every evening. 30 tablet 0   linaclotide (LINZESS) 72 MCG capsule Take 1 - 3 capsules by mouth daily as needed. 30 capsule 0   linaclotide (LINZESS) 72 MCG capsule Take 1 capsule by mouth once daily at least 30  minutes before the first meal of the day on an empty stomach 30 capsule 0   Multiple Vitamin (MULTIVITAMIN WITH MINERALS) TABS tablet Take 1 tablet by mouth 2 (two) times daily.     phentermine (ADIPEX-P) 37.5 MG tablet Take 1/2 tablet by mouth twice daily 30 tablet 0   Semaglutide-Weight Management (WEGOVY) 1 MG/0.5ML SOAJ Inject 1 mg (0.25m) under the skin once weekly 2 mL 0   Semaglutide-Weight Management (WEGOVY) 1 MG/0.5ML SOAJ Inject '1mg'$  (1 pen) under the skin once weekly as directed 2 mL 0   Sennosides-Docusate Sodium (SENNA S PO) Take 2 tablets by mouth 2 (two) times daily.     sertraline (ZOLOFT) 50 MG tablet Take 2 tablets by mouth once a day. 180 tablet 0   sertraline (ZOLOFT) 50 MG tablet Take 1 tablet by mouth once a day 90 tablet 1   tirzepatide (MOUNJARO) 5 MG/0.5ML Pen Inject 5 mg into the skin once a week. 6 mL 2   zolpidem (AMBIEN) 10 MG tablet Take 1 tablet (10 mg total) by mouth at  bedtime. 90 tablet 1   zolpidem (AMBIEN) 10 MG tablet Take 1 tablet by mouth at bedtime. 90 tablet 1   No current facility-administered medications on file prior to visit.     Allergies:  Allergies  Allergen Reactions   Nsaids     Cannot take NSAIDS hx gastric bypass surg   Penicillins Rash    Had rash as a child,  (has recently tolerated Augmentin without problems )  Has patient had a PCN reaction causing immediate rash, facial/tongue/throat swelling, SOB or lightheadedness with hypotension: Yes Has patient had a PCN reaction causing severe rash involving mucus membranes or skin necrosis: No Has patient had a PCN reaction that required hospitalization: No Has patient had a PCN reaction occurring within the last 10 years: No      Estimated body mass index is 25.24 kg/m as calculated from the following:   Height as of 03/12/22: '5\' 2"'$  (1.575 m).   Weight as of 03/12/22: 138 lb (62.6 kg).

## 2022-06-11 ENCOUNTER — Other Ambulatory Visit (HOSPITAL_COMMUNITY): Payer: Self-pay

## 2022-06-11 ENCOUNTER — Ambulatory Visit (HOSPITAL_COMMUNITY)
Admission: RE | Admit: 2022-06-11 | Discharge: 2022-06-11 | Disposition: A | Discharge status: Home / Self Care | Payer: 59 | Source: Ambulatory Visit | Attending: Family Medicine | Admitting: Family Medicine

## 2022-06-11 ENCOUNTER — Ambulatory Visit: Payer: 59

## 2022-06-11 DIAGNOSIS — R928 Other abnormal and inconclusive findings on diagnostic imaging of breast: Secondary | ICD-10-CM

## 2022-06-11 DIAGNOSIS — R632 Polyphagia: Secondary | ICD-10-CM | POA: Diagnosis not present

## 2022-06-11 DIAGNOSIS — F3289 Other specified depressive episodes: Secondary | ICD-10-CM | POA: Diagnosis not present

## 2022-06-11 DIAGNOSIS — H5201 Hypermetropia, right eye: Secondary | ICD-10-CM | POA: Diagnosis not present

## 2022-06-11 DIAGNOSIS — H524 Presbyopia: Secondary | ICD-10-CM | POA: Diagnosis not present

## 2022-06-11 DIAGNOSIS — Z6823 Body mass index (BMI) 23.0-23.9, adult: Secondary | ICD-10-CM | POA: Diagnosis not present

## 2022-06-11 DIAGNOSIS — Z8639 Personal history of other endocrine, nutritional and metabolic disease: Secondary | ICD-10-CM | POA: Diagnosis not present

## 2022-06-11 DIAGNOSIS — N6489 Other specified disorders of breast: Secondary | ICD-10-CM | POA: Diagnosis not present

## 2022-06-11 DIAGNOSIS — E785 Hyperlipidemia, unspecified: Secondary | ICD-10-CM | POA: Diagnosis not present

## 2022-06-12 ENCOUNTER — Other Ambulatory Visit (HOSPITAL_COMMUNITY): Payer: Self-pay

## 2022-06-12 MED ORDER — WEGOVY 1.7 MG/0.75ML ~~LOC~~ SOAJ
SUBCUTANEOUS | 0 refills | Status: DC
  Filled 2022-06-12: qty 3, 28d supply, fill #0

## 2022-06-18 ENCOUNTER — Other Ambulatory Visit (HOSPITAL_COMMUNITY): Payer: Self-pay

## 2022-06-19 NOTE — Progress Notes (Signed)
ASA 2, okay to schedule.   Recommend increasing Linzess dose to 290 mcg total daily for 3 days prior to procedure given history of constipation.   Will need to hold all weight loss medications for 7 days prior to procedure (phentermine, wegovy, and mounjaro). No injections within 7 days prior to procedure. If taking for elevated BG/diabetes then she may continue.

## 2022-06-20 ENCOUNTER — Telehealth (INDEPENDENT_AMBULATORY_CARE_PROVIDER_SITE_OTHER): Payer: Self-pay

## 2022-06-20 ENCOUNTER — Encounter (INDEPENDENT_AMBULATORY_CARE_PROVIDER_SITE_OTHER): Payer: Self-pay

## 2022-06-20 ENCOUNTER — Other Ambulatory Visit (INDEPENDENT_AMBULATORY_CARE_PROVIDER_SITE_OTHER): Payer: Self-pay

## 2022-06-20 ENCOUNTER — Other Ambulatory Visit (HOSPITAL_COMMUNITY): Payer: Self-pay

## 2022-06-20 DIAGNOSIS — Z1211 Encounter for screening for malignant neoplasm of colon: Secondary | ICD-10-CM

## 2022-06-20 MED ORDER — CLENPIQ 10-3.5-12 MG-GM -GM/175ML PO SOLN
1.0000 | Freq: Once | ORAL | 0 refills | Status: AC
  Filled 2022-06-20: qty 350, 1d supply, fill #0

## 2022-06-20 NOTE — Telephone Encounter (Signed)
Pauleen Goleman Ann Cordarrius Coad, CMA  ?

## 2022-06-21 ENCOUNTER — Encounter (HOSPITAL_COMMUNITY): Payer: Self-pay | Admitting: *Deleted

## 2022-06-21 ENCOUNTER — Other Ambulatory Visit (HOSPITAL_COMMUNITY): Payer: Self-pay

## 2022-06-22 ENCOUNTER — Other Ambulatory Visit (HOSPITAL_COMMUNITY): Payer: Self-pay

## 2022-06-22 MED ORDER — PHENTERMINE HCL 37.5 MG PO TABS
18.7500 mg | ORAL_TABLET | Freq: Two times a day (BID) | ORAL | 0 refills | Status: DC
  Filled 2022-06-22: qty 30, 30d supply, fill #0

## 2022-06-25 ENCOUNTER — Other Ambulatory Visit (HOSPITAL_COMMUNITY): Payer: Self-pay

## 2022-06-25 ENCOUNTER — Encounter (INDEPENDENT_AMBULATORY_CARE_PROVIDER_SITE_OTHER): Payer: Self-pay | Admitting: *Deleted

## 2022-06-26 ENCOUNTER — Other Ambulatory Visit (HOSPITAL_COMMUNITY): Payer: Self-pay

## 2022-06-26 MED ORDER — LINZESS 72 MCG PO CAPS
ORAL_CAPSULE | ORAL | 0 refills | Status: DC
  Filled 2022-06-26: qty 30, 30d supply, fill #0

## 2022-07-04 ENCOUNTER — Ambulatory Visit (INDEPENDENT_AMBULATORY_CARE_PROVIDER_SITE_OTHER): Payer: 59 | Admitting: Adult Health

## 2022-07-04 ENCOUNTER — Encounter: Payer: Self-pay | Admitting: Adult Health

## 2022-07-04 VITALS — BP 119/71 | HR 66 | Ht 61.0 in | Wt 131.0 lb

## 2022-07-04 DIAGNOSIS — Z01419 Encounter for gynecological examination (general) (routine) without abnormal findings: Secondary | ICD-10-CM | POA: Diagnosis not present

## 2022-07-04 DIAGNOSIS — Z1211 Encounter for screening for malignant neoplasm of colon: Secondary | ICD-10-CM

## 2022-07-04 LAB — HEMOCCULT GUIAC POC 1CARD (OFFICE): Fecal Occult Blood, POC: NEGATIVE

## 2022-07-04 NOTE — Progress Notes (Signed)
Patient ID: [name], female   DOB: 1971/06/13, 51 y.o.   MRN: [id number] History of Present Illness: [name] is a 51 year old white female, married, PM, in for well woman gyn exam. She has lost over a 100 lbs with injection. She is DON at [facility].  PCP is Dr [name].    Current Medications, Allergies, Past Medical History, Past Surgical History, Family History and Social History were reviewed in [employer] record.     Review of Systems: Patient denies any headaches, hearing loss, fatigue, blurred vision, shortness of breath, chest pain, abdominal pain, problems with  urination, or intercourse.(Not active). No joint pain or mood swings.  Has constipation, but since sleeve in 2018 does not drink but about 20 ozs a day. Denies any vaginal bleeding   Physical Exam:BP 119/71 (BP Location: Left Arm, Patient Position: Sitting, Cuff Size: Normal)   Pulse 66   Ht '5\' 1"'$  (1.549 m)   Wt 131 lb (59.4 kg)   LMP 09/18/2017   BMI 24.75 kg/m   General:  Well developed, well nourished, no acute distress Skin:  Warm and dry Neck:  Midline trachea, normal thyroid, good ROM, no lymphadenopathy Lungs; Clear to auscultation bilaterally Breast:  No dominant palpable mass, retraction, or nipple discharge Cardiovascular: Regular rate and rhythm Abdomen:  Soft, non tender, no hepatosplenomegaly Pelvic:  External genitalia is normal in appearance, no lesions.  The vagina is pale. Urethra has no lesions or masses. The cervix is smooth.   Uterus is felt to be normal size, shape, and contour.  No adnexal masses or tenderness noted.Bladder is non tender, no masses felt. Rectal: Good sphincter tone, no polyps, or hemorrhoids felt.  Hemoccult negative. Extremities/musculoskeletal:  No swelling or varicosities noted, no clubbing or cyanosis Psych:  No mood changes, alert and cooperative,seems happy AA is 1 Fall risk is moderate    07/04/2022   11:36 AM 07/10/2021    7:49 AM 07/27/2020     8:36 AM  Depression screen PHQ 2/9  Decreased Interest 0 1 1  Down, Depressed, Hopeless '1 1 1  '$ PHQ - 2 Score '1 2 2  '$ Altered sleeping 2 2 0  Tired, decreased energy '1 3 1  '$ Change in appetite 0 3 3  Feeling bad or failure about yourself  0 1 0  Trouble concentrating '2 1 2  '$ Moving slowly or fidgety/restless 0 1 0  Suicidal thoughts 0 0 0  PHQ-9 Score '6 13 8  '$ Difficult doing work/chores  Somewhat difficult    She is on meds by PCP    07/04/2022   11:36 AM 07/27/2020    8:36 AM  GAD 7 : Generalized Anxiety Score  Nervous, Anxious, on Edge 2 2  Control/stop worrying 0 1  Worry too much - different things 2 1  Trouble relaxing 1 1  Restless 0 1  Easily annoyed or irritable 1 1  Afraid - awful might happen 0 1  Total GAD 7 Score 6 8      Upstream - 07/04/22 1156       Pregnancy Intention Screening   Does the patient want to become pregnant in the next year? No    Does the patient's partner want to become pregnant in the next year? No    Would the patient like to discuss contraceptive options today? No      Contraception Wrap Up   Current Method Female Sterilization    End Method Female Sterilization  Contraception Counseling Provided No            Examination chaperoned by Levy Pupa LPN   Impression and Plan: 1. Encounter for well woman exam with routine gynecological exam Pap and physical in 1 year Mammogram was 06/11/22 Labs with PCP Colonoscopy scheduled for 07/23/22 with Dr [name]   2. Encounter for screening fecal occult blood testing Hemoccult was negative

## 2022-07-18 ENCOUNTER — Other Ambulatory Visit (HOSPITAL_COMMUNITY): Payer: Self-pay

## 2022-07-23 ENCOUNTER — Ambulatory Visit (HOSPITAL_COMMUNITY)
Admission: RE | Admit: 2022-07-23 | Discharge: 2022-07-23 | Disposition: A | Discharge status: Home / Self Care | Payer: 59 | Attending: Internal Medicine | Admitting: Internal Medicine

## 2022-07-23 ENCOUNTER — Encounter (HOSPITAL_COMMUNITY): Payer: Self-pay

## 2022-07-23 ENCOUNTER — Encounter (HOSPITAL_COMMUNITY)
Admission: RE | Disposition: A | Discharge status: Home / Self Care | Payer: Self-pay | Source: Home / Self Care | Attending: Internal Medicine

## 2022-07-23 ENCOUNTER — Other Ambulatory Visit: Payer: Self-pay

## 2022-07-23 ENCOUNTER — Ambulatory Visit (HOSPITAL_BASED_OUTPATIENT_CLINIC_OR_DEPARTMENT_OTHER): Payer: 59 | Admitting: Anesthesiology

## 2022-07-23 ENCOUNTER — Ambulatory Visit (HOSPITAL_COMMUNITY): Payer: 59 | Admitting: Anesthesiology

## 2022-07-23 DIAGNOSIS — F419 Anxiety disorder, unspecified: Secondary | ICD-10-CM | POA: Diagnosis not present

## 2022-07-23 DIAGNOSIS — F32A Depression, unspecified: Secondary | ICD-10-CM | POA: Diagnosis not present

## 2022-07-23 DIAGNOSIS — D125 Benign neoplasm of sigmoid colon: Secondary | ICD-10-CM | POA: Insufficient documentation

## 2022-07-23 DIAGNOSIS — Z9884 Bariatric surgery status: Secondary | ICD-10-CM | POA: Diagnosis not present

## 2022-07-23 DIAGNOSIS — K635 Polyp of colon: Secondary | ICD-10-CM

## 2022-07-23 DIAGNOSIS — K219 Gastro-esophageal reflux disease without esophagitis: Secondary | ICD-10-CM | POA: Diagnosis not present

## 2022-07-23 DIAGNOSIS — K648 Other hemorrhoids: Secondary | ICD-10-CM | POA: Insufficient documentation

## 2022-07-23 DIAGNOSIS — G4730 Sleep apnea, unspecified: Secondary | ICD-10-CM | POA: Diagnosis not present

## 2022-07-23 DIAGNOSIS — Z87891 Personal history of nicotine dependence: Secondary | ICD-10-CM | POA: Diagnosis not present

## 2022-07-23 DIAGNOSIS — Z1211 Encounter for screening for malignant neoplasm of colon: Secondary | ICD-10-CM

## 2022-07-23 DIAGNOSIS — Z1212 Encounter for screening for malignant neoplasm of rectum: Secondary | ICD-10-CM | POA: Diagnosis not present

## 2022-07-23 DIAGNOSIS — G709 Myoneural disorder, unspecified: Secondary | ICD-10-CM | POA: Diagnosis not present

## 2022-07-23 HISTORY — PX: POLYPECTOMY: SHX5525

## 2022-07-23 HISTORY — PX: COLONOSCOPY WITH PROPOFOL: SHX5780

## 2022-07-23 SURGERY — COLONOSCOPY WITH PROPOFOL
Anesthesia: General

## 2022-07-23 MED ORDER — PROPOFOL 10 MG/ML IV BOLUS
INTRAVENOUS | Status: DC | PRN
  Administered 2022-07-23 (×2): 20 mg via INTRAVENOUS
  Administered 2022-07-23: 10 mg via INTRAVENOUS
  Administered 2022-07-23: 30 mg via INTRAVENOUS
  Administered 2022-07-23: 20 mg via INTRAVENOUS

## 2022-07-23 MED ORDER — PROPOFOL 500 MG/50ML IV EMUL
INTRAVENOUS | Status: DC | PRN
  Administered 2022-07-23: 150 ug/kg/min via INTRAVENOUS

## 2022-07-23 MED ORDER — LIDOCAINE HCL (CARDIAC) PF 100 MG/5ML IV SOSY
PREFILLED_SYRINGE | INTRAVENOUS | Status: DC | PRN
  Administered 2022-07-23: 40 mg via INTRAVENOUS

## 2022-07-23 MED ORDER — LACTATED RINGERS IV SOLN: INTRAVENOUS | Status: DC

## 2022-07-23 MED ORDER — STERILE WATER FOR IRRIGATION IR SOLN
Status: DC | PRN
  Administered 2022-07-23: 60 mL
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
## 2022-07-23 NOTE — Transfer of Care (Signed)
Immediate Anesthesia Transfer of Care Note  Patient: [name]  Procedure(s) Performed: COLONOSCOPY WITH PROPOFOL POLYPECTOMY  Patient Location: Endoscopy Unit  Anesthesia Type:MAC  Level of Consciousness: awake  Airway & Oxygen Therapy: Patient Spontanous Breathing  Post-op Assessment: Report given to RN and Post -op Vital signs reviewed and stable  Post vital signs: Reviewed and stable  Last Vitals:  Vitals Value Taken Time  BP    Temp    Pulse    Resp    SpO2      Last Pain:  Vitals:   07/23/22 0831  TempSrc:   PainSc: 0-No pain      Patients Stated Pain Goal: 6 (00/45/99 7741)  Complications: No notable events documented.

## 2022-07-23 NOTE — Anesthesia Postprocedure Evaluation (Signed)
Anesthesia Post Note  Patient: [name]  Procedure(s) Performed: COLONOSCOPY WITH PROPOFOL POLYPECTOMY  Patient location during evaluation: Phase II Anesthesia Type: General Level of consciousness: awake and alert and oriented Pain management: pain level controlled Vital Signs Assessment: post-procedure vital signs reviewed and stable Respiratory status: spontaneous breathing, nonlabored ventilation and respiratory function stable Cardiovascular status: blood pressure returned to baseline and stable Postop Assessment: no apparent nausea or vomiting Anesthetic complications: no   No notable events documented.   Last Vitals:  Vitals:   07/23/22 0705 07/23/22 0857  BP: 113/67 106/60  Pulse: 63 73  Resp: 14 13  Temp: 36.6 C 36.5 C  SpO2: 100% 100%    Last Pain:  Vitals:   07/23/22 0857  TempSrc: Oral  PainSc: 0-No pain                 Wynn Alldredge C Araminta Zorn

## 2022-07-23 NOTE — Anesthesia Preprocedure Evaluation (Addendum)
Anesthesia Evaluation  Patient identified by MRN, date of birth, ID band Patient awake    Reviewed: Allergy & Precautions, NPO status , Patient's Chart, lab work & pertinent test results  Airway Mallampati: II  TM Distance: >3 FB Neck ROM: Full    Dental  (+) Dental Advisory Given   Pulmonary sleep apnea , former smoker,    Pulmonary exam normal breath sounds clear to auscultation       Cardiovascular negative cardio ROS Normal cardiovascular exam Rhythm:Regular Rate:Normal     Neuro/Psych PSYCHIATRIC DISORDERS Anxiety Depression  Neuromuscular disease (Unspecified hereditary and idiopathic peripheral neuropathy  right leg numb )    GI/Hepatic Neg liver ROS, GERD (sleeve gastrectomy)  Medicated,  Endo/Other  negative endocrine ROS  Renal/GU negative Renal ROS  negative genitourinary   Musculoskeletal negative musculoskeletal ROS (+)   Abdominal   Peds negative pediatric ROS (+)  Hematology negative hematology ROS (+)   Anesthesia Other Findings Last dose of wegowy. >7 days  Reproductive/Obstetrics negative OB ROS                           Anesthesia Physical Anesthesia Plan  ASA: 2  Anesthesia Plan: General   Post-op Pain Management: Minimal or no pain anticipated   Induction: Intravenous  PONV Risk Score and Plan: Propofol infusion  Airway Management Planned: Nasal Cannula and Natural Airway  Additional Equipment:   Intra-op Plan:   Post-operative Plan:   Informed Consent: I have reviewed the patients History and Physical, chart, labs and discussed the procedure including the risks, benefits and alternatives for the proposed anesthesia with the patient or authorized representative who has indicated his/her understanding and acceptance.     Dental advisory given  Plan Discussed with: CRNA and Surgeon  Anesthesia Plan Comments:         Anesthesia Quick  Evaluation

## 2022-07-23 NOTE — Discharge Instructions (Addendum)
  Colonoscopy Discharge Instructions  Read the instructions outlined below and refer to this sheet in the next few weeks. These discharge instructions provide you with general information on caring for yourself after you leave the hospital. Your doctor may also give you specific instructions. While your treatment has been planned according to the most current medical practices available, unavoidable complications occasionally occur.   ACTIVITY You may resume your regular activity, but move at a slower pace for the next 24 hours.  Take frequent rest periods for the next 24 hours.  Walking will help get rid of the air and reduce the bloated feeling in your belly (abdomen).  No driving for 24 hours (because of the medicine (anesthesia) used during the test).   Do not sign any important legal documents or operate any machinery for 24 hours (because of the anesthesia used during the test).  NUTRITION Drink plenty of fluids.  You may resume your normal diet as instructed by your doctor.  Begin with a light meal and progress to your normal diet. Heavy or fried foods are harder to digest and may make you feel sick to your stomach (nauseated).  Avoid alcoholic beverages for 24 hours or as instructed.  MEDICATIONS You may resume your normal medications unless your doctor tells you otherwise.  WHAT YOU CAN EXPECT TODAY Some feelings of bloating in the abdomen.  Passage of more gas than usual.  Spotting of blood in your stool or on the toilet paper.  IF YOU HAD POLYPS REMOVED DURING THE COLONOSCOPY: No aspirin products for 7 days or as instructed.  No alcohol for 7 days or as instructed.  Eat a soft diet for the next 24 hours.  FINDING OUT THE RESULTS OF YOUR TEST Not all test results are available during your visit. If your test results are not back during the visit, make an appointment with your caregiver to find out the results. Do not assume everything is normal if you have not heard from your  caregiver or the medical facility. It is important for you to follow up on all of your test results.  SEEK IMMEDIATE MEDICAL ATTENTION IF: You have more than a spotting of blood in your stool.  Your belly is swollen (abdominal distention).  You are nauseated or vomiting.  You have a temperature over 101.  You have abdominal pain or discomfort that is severe or gets worse throughout the day.   Your colonoscopy revealed 1 polyp(s) which I removed successfully. Await pathology results, my office will contact you. I recommend repeating colonoscopy in 5-10 years for surveillance purposes depending on pathology. Otherwise follow up with Gi as needed.     I hope you have a great rest of your week!  Elon Alas. Abbey Chatters, D.O. Gastroenterology and Hepatology [facility]

## 2022-07-23 NOTE — Op Note (Signed)
[facility] Patient Name: [name] Procedure Date: 07/23/2022 7:47 AM MRN: [id number] Date of Birth: February 25, 1971 Attending MD: Elon Alas. Abbey Chatters DO CSN: [id number] Age: 51 Admit Type: Outpatient Procedure:                Colonoscopy Indications:              Screening for colorectal malignant neoplasm Providers:                Elon Alas. Abbey Chatters, DO, Lambert Mody, Sheilah Mins, Technician Referring MD:              Medicines:                See the Anesthesia note for documentation of the                            administered medications Complications:            No immediate complications. Estimated Blood Loss:     Estimated blood loss was minimal. Procedure:                Pre-Anesthesia Assessment:                           - The anesthesia plan was to use monitored                            anesthesia care (MAC).                           After obtaining informed consent, the colonoscope                            was passed under direct vision. Throughout the                            procedure, the patient's blood pressure, pulse, and                            oxygen saturations were monitored continuously. The                            PCF-HQ190L ([id number]) scope was introduced through                            the anus and advanced to the the cecum, identified                            by appendiceal orifice and ileocecal valve. The                            colonoscopy was performed without difficulty. The                            patient tolerated the procedure well.  The quality                            of the bowel preparation was evaluated using the                            BBPS [facility] Bowel Preparation Scale) with scores                            of: Right Colon = 2 (minor amount of residual                            staining, small fragments of stool and/or opaque                            liquid, but  mucosa seen well), Transverse Colon = 2                            (minor amount of residual staining, small fragments                            of stool and/or opaque liquid, but mucosa seen                            well) and Left Colon = 2 (minor amount of residual                            staining, small fragments of stool and/or opaque                            liquid, but mucosa seen well). The total BBPS score                            equals 6. The quality of the bowel preparation was                            fair. Scope In: 8:34:56 AM Scope Out: 8:53:13 AM Scope Withdrawal Time: 0 hours 12 minutes 11 seconds  Total Procedure Duration: 0 hours 18 minutes 17 seconds  Findings:      The perianal and digital rectal examinations were normal.      Non-bleeding internal hemorrhoids were found during endoscopy.      A 4 mm polyp was found in the sigmoid colon. The polyp was sessile. The       polyp was removed with a cold snare. Resection and retrieval were       complete.      The exam was otherwise without abnormality. Impression:               - Preparation of the colon was fair.                           - Non-bleeding internal hemorrhoids.                           -  One 4 mm polyp in the sigmoid colon, removed with                            a cold snare. Resected and retrieved.                           - The examination was otherwise normal. Moderate Sedation:      Per Anesthesia Care Recommendation:           - Patient has a contact number available for                            emergencies. The signs and symptoms of potential                            delayed complications were discussed with the                            patient. Return to normal activities tomorrow.                            Written discharge instructions were provided to the                            patient.                           - Resume previous diet.                           -  Continue present medications.                           - Await pathology results.                           - Repeat colonoscopy in 5-10 years for surveillance.                           - Return to GI clinic PRN. Procedure Code(s):        --- Professional ---                           [phone], Colonoscopy, flexible; with removal of                            tumor(s), polyp(s), or other lesion(s) by snare                            technique Diagnosis Code(s):        --- Professional ---                           Z12.11, Encounter for screening for malignant  neoplasm of colon                           K64.8, Other hemorrhoids                           K63.5, Polyp of colon CPT copyright 2019 American Medical Association. All rights reserved. The codes documented in this report are preliminary and upon coder review may  be revised to meet current compliance requirements. Elon Alas. Abbey Chatters, DO [place] Abbey Chatters, DO 07/23/2022 8:56:14 AM This report has been signed electronically. Number of Addenda: 0

## 2022-07-24 ENCOUNTER — Encounter (INDEPENDENT_AMBULATORY_CARE_PROVIDER_SITE_OTHER): Payer: Self-pay

## 2022-07-24 LAB — SURGICAL PATHOLOGY

## 2022-07-26 ENCOUNTER — Encounter (HOSPITAL_COMMUNITY): Payer: Self-pay | Admitting: Internal Medicine

## 2022-09-11 DIAGNOSIS — Z6823 Body mass index (BMI) 23.0-23.9, adult: Secondary | ICD-10-CM | POA: Diagnosis not present

## 2022-09-11 DIAGNOSIS — R3915 Urgency of urination: Secondary | ICD-10-CM | POA: Diagnosis not present

## 2022-09-11 DIAGNOSIS — E559 Vitamin D deficiency, unspecified: Secondary | ICD-10-CM | POA: Diagnosis not present

## 2022-09-11 DIAGNOSIS — G8929 Other chronic pain: Secondary | ICD-10-CM | POA: Diagnosis not present

## 2022-09-11 DIAGNOSIS — L304 Erythema intertrigo: Secondary | ICD-10-CM | POA: Diagnosis not present

## 2022-09-11 DIAGNOSIS — L987 Excessive and redundant skin and subcutaneous tissue: Secondary | ICD-10-CM | POA: Diagnosis not present

## 2022-09-11 DIAGNOSIS — Z903 Acquired absence of stomach [part of]: Secondary | ICD-10-CM | POA: Diagnosis not present

## 2022-09-11 DIAGNOSIS — Z8639 Personal history of other endocrine, nutritional and metabolic disease: Secondary | ICD-10-CM | POA: Diagnosis not present

## 2022-09-11 DIAGNOSIS — R632 Polyphagia: Secondary | ICD-10-CM | POA: Diagnosis not present

## 2022-09-13 ENCOUNTER — Other Ambulatory Visit (HOSPITAL_COMMUNITY): Payer: Self-pay

## 2022-09-13 MED ORDER — ERGOCALCIFEROL 1.25 MG (50000 UT) PO CAPS
1.0000 | ORAL_CAPSULE | ORAL | 0 refills | Status: AC
  Filled 2022-09-13: qty 12, 84d supply, fill #0

## 2022-09-16 ENCOUNTER — Other Ambulatory Visit (HOSPITAL_COMMUNITY): Payer: Self-pay

## 2022-09-17 ENCOUNTER — Other Ambulatory Visit (HOSPITAL_COMMUNITY): Payer: Self-pay

## 2022-09-17 MED ORDER — PHENTERMINE HCL 37.5 MG PO TABS
18.7500 mg | ORAL_TABLET | Freq: Two times a day (BID) | ORAL | 0 refills | Status: DC
  Filled 2022-09-17: qty 30, 30d supply, fill #0

## 2022-09-17 MED ORDER — LINZESS 72 MCG PO CAPS
72.0000 ug | ORAL_CAPSULE | Freq: Every day | ORAL | 0 refills | Status: DC
  Filled 2022-09-17: qty 30, 30d supply, fill #0

## 2022-09-17 MED ORDER — WEGOVY 1.7 MG/0.75ML ~~LOC~~ SOAJ
1.7000 mg | SUBCUTANEOUS | 0 refills | Status: DC
  Filled 2022-09-17: qty 3, 28d supply, fill #0

## 2022-09-18 ENCOUNTER — Other Ambulatory Visit (HOSPITAL_COMMUNITY): Payer: Self-pay

## 2022-09-18 ENCOUNTER — Telehealth: Payer: Self-pay | Admitting: Plastic Surgery

## 2022-09-18 NOTE — Telephone Encounter (Signed)
LVM and MyChart message to please contact office to reschedule appt for 11/6 with a different provider due to Dr. Erin Hearing leaving practice.

## 2022-09-19 ENCOUNTER — Other Ambulatory Visit (HOSPITAL_COMMUNITY): Payer: Self-pay

## 2022-10-18 ENCOUNTER — Institutional Professional Consult (permissible substitution): Payer: 59 | Admitting: Plastic Surgery

## 2022-10-21 ENCOUNTER — Institutional Professional Consult (permissible substitution): Payer: 59 | Admitting: Plastic Surgery

## 2022-10-23 ENCOUNTER — Other Ambulatory Visit (HOSPITAL_COMMUNITY): Payer: Self-pay

## 2022-10-24 ENCOUNTER — Other Ambulatory Visit (HOSPITAL_COMMUNITY): Payer: Self-pay

## 2022-10-24 MED ORDER — PHENTERMINE HCL 37.5 MG PO TABS
18.7500 mg | ORAL_TABLET | Freq: Two times a day (BID) | ORAL | 0 refills | Status: DC
  Filled 2022-10-24: qty 30, 30d supply, fill #0

## 2022-10-24 MED ORDER — LINZESS 72 MCG PO CAPS
72.0000 ug | ORAL_CAPSULE | Freq: Every day | ORAL | 0 refills | Status: DC
  Filled 2022-10-24: qty 30, 30d supply, fill #0

## 2022-10-24 MED ORDER — WEGOVY 1.7 MG/0.75ML ~~LOC~~ SOAJ
1.7000 mg | SUBCUTANEOUS | 0 refills | Status: DC
  Filled 2022-10-24: qty 3, 28d supply, fill #0

## 2022-10-24 MED ORDER — ZOLPIDEM TARTRATE 10 MG PO TABS
10.0000 mg | ORAL_TABLET | Freq: Every day | ORAL | 0 refills | Status: DC
  Filled 2022-10-24: qty 90, 90d supply, fill #0

## 2022-10-25 ENCOUNTER — Other Ambulatory Visit (HOSPITAL_COMMUNITY): Payer: Self-pay

## 2022-10-30 ENCOUNTER — Other Ambulatory Visit (HOSPITAL_COMMUNITY): Payer: Self-pay

## 2022-11-18 ENCOUNTER — Encounter (HOSPITAL_COMMUNITY): Payer: Self-pay

## 2022-11-18 ENCOUNTER — Other Ambulatory Visit (HOSPITAL_COMMUNITY): Payer: Self-pay

## 2022-11-18 DIAGNOSIS — G4700 Insomnia, unspecified: Secondary | ICD-10-CM | POA: Diagnosis not present

## 2022-11-18 DIAGNOSIS — Z8639 Personal history of other endocrine, nutritional and metabolic disease: Secondary | ICD-10-CM | POA: Diagnosis not present

## 2022-11-18 DIAGNOSIS — R632 Polyphagia: Secondary | ICD-10-CM | POA: Diagnosis not present

## 2022-11-18 DIAGNOSIS — Z6835 Body mass index (BMI) 35.0-35.9, adult: Secondary | ICD-10-CM | POA: Diagnosis not present

## 2022-11-20 ENCOUNTER — Other Ambulatory Visit (HOSPITAL_COMMUNITY): Payer: Self-pay

## 2022-11-20 MED ORDER — PHENTERMINE HCL 37.5 MG PO TABS
18.7400 mg | ORAL_TABLET | Freq: Two times a day (BID) | ORAL | 0 refills | Status: DC
  Filled 2022-11-20: qty 30, 30d supply, fill #0

## 2022-11-21 ENCOUNTER — Other Ambulatory Visit (HOSPITAL_COMMUNITY): Payer: Self-pay

## 2022-11-21 MED ORDER — ZEPBOUND 7.5 MG/0.5ML ~~LOC~~ SOAJ
7.5000 mg | SUBCUTANEOUS | 1 refills | Status: DC
  Filled 2022-12-04: qty 1, 14d supply, fill #0
  Filled 2022-12-06 – 2022-12-07 (×2): qty 2, 28d supply, fill #0
  Filled 2023-01-16 – 2023-03-10 (×2): qty 2, 28d supply, fill #1

## 2022-11-21 MED ORDER — LINZESS 72 MCG PO CAPS
ORAL_CAPSULE | ORAL | 0 refills | Status: DC
  Filled 2022-11-21: qty 30, 30d supply, fill #0

## 2022-12-04 ENCOUNTER — Other Ambulatory Visit (HOSPITAL_COMMUNITY): Payer: Self-pay

## 2022-12-04 ENCOUNTER — Other Ambulatory Visit: Payer: Self-pay

## 2022-12-05 ENCOUNTER — Other Ambulatory Visit: Payer: Self-pay

## 2022-12-06 ENCOUNTER — Other Ambulatory Visit (HOSPITAL_COMMUNITY): Payer: Self-pay

## 2022-12-07 ENCOUNTER — Other Ambulatory Visit: Payer: Self-pay

## 2022-12-10 ENCOUNTER — Other Ambulatory Visit (HOSPITAL_COMMUNITY): Payer: Self-pay

## 2022-12-10 ENCOUNTER — Other Ambulatory Visit: Payer: Self-pay

## 2023-01-01 DIAGNOSIS — E7800 Pure hypercholesterolemia, unspecified: Secondary | ICD-10-CM | POA: Diagnosis not present

## 2023-01-01 DIAGNOSIS — R632 Polyphagia: Secondary | ICD-10-CM | POA: Diagnosis not present

## 2023-01-01 DIAGNOSIS — L304 Erythema intertrigo: Secondary | ICD-10-CM | POA: Diagnosis not present

## 2023-01-01 DIAGNOSIS — Z6823 Body mass index (BMI) 23.0-23.9, adult: Secondary | ICD-10-CM | POA: Diagnosis not present

## 2023-01-01 DIAGNOSIS — Z8639 Personal history of other endocrine, nutritional and metabolic disease: Secondary | ICD-10-CM | POA: Diagnosis not present

## 2023-01-02 ENCOUNTER — Other Ambulatory Visit (HOSPITAL_COMMUNITY): Payer: Self-pay

## 2023-01-02 MED ORDER — PHENTERMINE HCL 37.5 MG PO TABS
18.7500 mg | ORAL_TABLET | Freq: Two times a day (BID) | ORAL | 0 refills | Status: DC
  Filled 2023-01-02: qty 90, 90d supply, fill #0

## 2023-01-02 MED ORDER — ZEPBOUND 7.5 MG/0.5ML ~~LOC~~ SOAJ
7.5000 mg | SUBCUTANEOUS | 0 refills | Status: DC
  Filled 2023-01-02 – 2023-01-21 (×2): qty 2, 28d supply, fill #0

## 2023-01-02 MED ORDER — NYSTATIN 100000 UNIT/GM EX OINT
1.0000 | TOPICAL_OINTMENT | Freq: Two times a day (BID) | CUTANEOUS | 2 refills | Status: DC | PRN
  Filled 2023-01-02: qty 15, 30d supply, fill #0
  Filled 2023-02-11: qty 30, 15d supply, fill #1

## 2023-01-03 ENCOUNTER — Other Ambulatory Visit: Payer: Self-pay

## 2023-01-14 ENCOUNTER — Other Ambulatory Visit (HOSPITAL_COMMUNITY): Payer: Self-pay

## 2023-01-16 ENCOUNTER — Other Ambulatory Visit: Payer: Self-pay

## 2023-01-16 ENCOUNTER — Other Ambulatory Visit (HOSPITAL_COMMUNITY): Payer: Self-pay

## 2023-01-17 ENCOUNTER — Other Ambulatory Visit: Payer: Self-pay

## 2023-01-17 ENCOUNTER — Other Ambulatory Visit (HOSPITAL_COMMUNITY): Payer: Self-pay

## 2023-01-17 MED ORDER — LINZESS 72 MCG PO CAPS
72.0000 ug | ORAL_CAPSULE | Freq: Every day | ORAL | 3 refills | Status: DC | PRN
  Filled 2023-01-17 – 2023-01-21 (×2): qty 30, 30d supply, fill #0
  Filled 2023-02-21: qty 30, 30d supply, fill #1
  Filled 2023-03-08 – 2023-03-25 (×3): qty 30, 30d supply, fill #2

## 2023-01-17 MED ORDER — BUPROPION HCL ER (XL) 300 MG PO TB24
300.0000 mg | ORAL_TABLET | Freq: Every morning | ORAL | 0 refills | Status: DC
  Filled 2023-01-17: qty 90, 90d supply, fill #0

## 2023-01-20 ENCOUNTER — Other Ambulatory Visit (HOSPITAL_COMMUNITY): Payer: Self-pay

## 2023-01-21 ENCOUNTER — Other Ambulatory Visit (HOSPITAL_COMMUNITY): Payer: Self-pay

## 2023-01-21 ENCOUNTER — Other Ambulatory Visit: Payer: Self-pay

## 2023-01-21 MED ORDER — ZOLPIDEM TARTRATE 10 MG PO TABS
10.0000 mg | ORAL_TABLET | Freq: Every evening | ORAL | 0 refills | Status: DC
  Filled 2023-01-21 (×2): qty 90, 90d supply, fill #0

## 2023-01-23 ENCOUNTER — Other Ambulatory Visit (HOSPITAL_COMMUNITY): Payer: Self-pay

## 2023-01-23 ENCOUNTER — Other Ambulatory Visit: Payer: Self-pay

## 2023-01-23 DIAGNOSIS — F5101 Primary insomnia: Secondary | ICD-10-CM | POA: Diagnosis not present

## 2023-01-23 DIAGNOSIS — F3341 Major depressive disorder, recurrent, in partial remission: Secondary | ICD-10-CM | POA: Diagnosis not present

## 2023-01-23 DIAGNOSIS — F419 Anxiety disorder, unspecified: Secondary | ICD-10-CM | POA: Diagnosis not present

## 2023-01-23 MED ORDER — ALPRAZOLAM 0.5 MG PO TABS
0.5000 mg | ORAL_TABLET | Freq: Two times a day (BID) | ORAL | 1 refills | Status: DC | PRN
  Filled 2023-01-23: qty 180, 90d supply, fill #0
  Filled 2023-02-21 – 2023-04-20 (×4): qty 180, 90d supply, fill #1

## 2023-01-23 MED ORDER — ZOLPIDEM TARTRATE 10 MG PO TABS
10.0000 mg | ORAL_TABLET | Freq: Every day | ORAL | 1 refills | Status: DC
  Filled 2023-02-21 – 2023-03-11 (×4): qty 90, 90d supply, fill #0

## 2023-02-04 ENCOUNTER — Other Ambulatory Visit (HOSPITAL_COMMUNITY): Payer: Self-pay

## 2023-02-11 ENCOUNTER — Other Ambulatory Visit (HOSPITAL_COMMUNITY): Payer: Self-pay

## 2023-02-11 ENCOUNTER — Other Ambulatory Visit: Payer: Self-pay

## 2023-02-12 ENCOUNTER — Other Ambulatory Visit (HOSPITAL_COMMUNITY): Payer: Self-pay

## 2023-02-12 DIAGNOSIS — R632 Polyphagia: Secondary | ICD-10-CM | POA: Diagnosis not present

## 2023-02-12 DIAGNOSIS — Z9884 Bariatric surgery status: Secondary | ICD-10-CM | POA: Diagnosis not present

## 2023-02-12 DIAGNOSIS — Z6823 Body mass index (BMI) 23.0-23.9, adult: Secondary | ICD-10-CM | POA: Diagnosis not present

## 2023-02-12 DIAGNOSIS — R7301 Impaired fasting glucose: Secondary | ICD-10-CM | POA: Diagnosis not present

## 2023-02-12 DIAGNOSIS — E559 Vitamin D deficiency, unspecified: Secondary | ICD-10-CM | POA: Diagnosis not present

## 2023-02-13 ENCOUNTER — Other Ambulatory Visit (HOSPITAL_COMMUNITY): Payer: Self-pay

## 2023-02-13 ENCOUNTER — Encounter: Payer: Self-pay | Admitting: Radiology

## 2023-02-13 MED ORDER — ZEPBOUND 15 MG/0.5ML ~~LOC~~ SOAJ
15.0000 mg | SUBCUTANEOUS | 0 refills | Status: DC
  Filled 2023-02-13: qty 2, 28d supply, fill #0
  Filled 2023-03-08 – 2023-03-11 (×3): qty 2, 28d supply, fill #1
  Filled 2023-04-08: qty 2, 28d supply, fill #2

## 2023-02-17 ENCOUNTER — Other Ambulatory Visit (HOSPITAL_COMMUNITY): Payer: Self-pay

## 2023-02-17 MED ORDER — PHENTERMINE HCL 37.5 MG PO TABS
18.7500 mg | ORAL_TABLET | Freq: Two times a day (BID) | ORAL | 0 refills | Status: DC
  Filled 2023-03-08 – 2023-04-06 (×3): qty 90, 90d supply, fill #0

## 2023-02-21 ENCOUNTER — Other Ambulatory Visit: Payer: Self-pay

## 2023-02-21 ENCOUNTER — Other Ambulatory Visit (HOSPITAL_COMMUNITY): Payer: Self-pay

## 2023-02-26 ENCOUNTER — Encounter: Payer: Self-pay | Admitting: Orthopedic Surgery

## 2023-02-26 ENCOUNTER — Ambulatory Visit (INDEPENDENT_AMBULATORY_CARE_PROVIDER_SITE_OTHER): Payer: Commercial Managed Care - PPO

## 2023-02-26 ENCOUNTER — Ambulatory Visit: Payer: Commercial Managed Care - PPO | Admitting: Orthopedic Surgery

## 2023-02-26 VITALS — BP 129/81 | HR 83 | Ht 61.0 in | Wt 134.0 lb

## 2023-02-26 DIAGNOSIS — M25531 Pain in right wrist: Secondary | ICD-10-CM

## 2023-02-26 DIAGNOSIS — G5621 Lesion of ulnar nerve, right upper limb: Secondary | ICD-10-CM | POA: Diagnosis not present

## 2023-02-26 NOTE — Progress Notes (Signed)
Orthopaedic Clinic Return  Assessment: [name] is a 52 y.o. female with the following: Right cubital tunnel syndrome; mild symptoms overall   Plan: [name] has pain and symptoms consistent with cubital tunnel syndrome.  She has good strength.  Her symptoms are mild.  We discussed simple treatment options, but she is not interested in medicines.  She cannot take NSAIDs.  She will defer a referral for OT at this point for a brace.  Brace at home, keeping elbow straight at night.  Continue with activities as tolerated.  She will return to clinic as needed.   Follow-up: Return if symptoms worsen or fail to improve.   Subjective:  Chief Complaint  Patient presents with   Wrist Pain    Right     History of Present Illness: [name] is a 52 y.o. female who returns to clinic for evaluation of right wrist pain.  She states that she started working out recently.  The pain initially started in the medial right elbow, but she is now complaining more pain in the ulnar side of her hand.  She does have some symptoms radiating into her small finger.  She wears a wrist splint at night, and also uses braces when she works out.  Pain gets worse following her workouts.  She has taken Tylenol at nighttime.  No additional medications.  No specific injury.  Occasional numbness and tingling in her small finger.  Review of Systems: No fevers or chills + numbness or tingling No chest pain No shortness of breath No bowel or bladder dysfunction No GI distress No headaches   Objective: BP 129/81   Pulse 83   Ht '5\' 1"'$  (1.549 m)   Wt 134 lb (60.8 kg)   LMP 09/18/2017   BMI 25.32 kg/m   Physical Exam:  Alert and oriented.  No acute distress.  Evaluation of right upper extremity demonstrates no atrophy.  No deformity.  No tenderness to palpation over the ulnar styloid.  No pain with radial or ulnar deviation.  No tenderness to palpation over the first dorsal compartment.  She has  good grip strength.  Positive Tinel's at the elbow.  Fingers are warm and well-perfused.  Sensation intact throughout the right hand.  IMAGING: I personally ordered and reviewed the following images:  X-rays of the right wrist were obtained in clinic today.  No acute injuries are noted.  Good overall alignment.  Well-maintained joint spaces.  No bony lesions.  Impression: Negative right wrist x-ray   Mordecai Rasmussen, MD 02/26/2023 9:31 AM

## 2023-03-08 ENCOUNTER — Other Ambulatory Visit (HOSPITAL_COMMUNITY): Payer: Self-pay

## 2023-03-09 ENCOUNTER — Other Ambulatory Visit: Payer: Self-pay

## 2023-03-10 ENCOUNTER — Other Ambulatory Visit (HOSPITAL_COMMUNITY): Payer: Self-pay

## 2023-03-10 ENCOUNTER — Other Ambulatory Visit: Payer: Self-pay

## 2023-03-10 ENCOUNTER — Other Ambulatory Visit (HOSPITAL_BASED_OUTPATIENT_CLINIC_OR_DEPARTMENT_OTHER): Payer: Self-pay

## 2023-03-11 ENCOUNTER — Other Ambulatory Visit (HOSPITAL_COMMUNITY): Payer: Self-pay

## 2023-03-17 ENCOUNTER — Other Ambulatory Visit (HOSPITAL_COMMUNITY): Payer: Self-pay

## 2023-03-25 ENCOUNTER — Other Ambulatory Visit (HOSPITAL_COMMUNITY): Payer: Self-pay

## 2023-04-07 ENCOUNTER — Other Ambulatory Visit: Payer: Self-pay

## 2023-04-08 ENCOUNTER — Other Ambulatory Visit: Payer: Self-pay

## 2023-04-11 ENCOUNTER — Telehealth: Payer: Self-pay

## 2023-04-11 ENCOUNTER — Other Ambulatory Visit: Payer: Self-pay

## 2023-04-11 ENCOUNTER — Other Ambulatory Visit (HOSPITAL_COMMUNITY): Payer: Self-pay

## 2023-04-11 ENCOUNTER — Encounter (HOSPITAL_COMMUNITY): Payer: Self-pay

## 2023-04-11 DIAGNOSIS — F3341 Major depressive disorder, recurrent, in partial remission: Secondary | ICD-10-CM | POA: Diagnosis not present

## 2023-04-11 DIAGNOSIS — E782 Mixed hyperlipidemia: Secondary | ICD-10-CM | POA: Diagnosis not present

## 2023-04-11 DIAGNOSIS — F5101 Primary insomnia: Secondary | ICD-10-CM | POA: Diagnosis not present

## 2023-04-11 DIAGNOSIS — E538 Deficiency of other specified B group vitamins: Secondary | ICD-10-CM | POA: Diagnosis not present

## 2023-04-11 DIAGNOSIS — K5909 Other constipation: Secondary | ICD-10-CM | POA: Diagnosis not present

## 2023-04-11 DIAGNOSIS — Z9884 Bariatric surgery status: Secondary | ICD-10-CM | POA: Diagnosis not present

## 2023-04-11 DIAGNOSIS — Z0000 Encounter for general adult medical examination without abnormal findings: Secondary | ICD-10-CM | POA: Diagnosis not present

## 2023-04-11 DIAGNOSIS — F419 Anxiety disorder, unspecified: Secondary | ICD-10-CM | POA: Diagnosis not present

## 2023-04-11 MED ORDER — ALPRAZOLAM 0.5 MG PO TABS
0.5000 mg | ORAL_TABLET | Freq: Two times a day (BID) | ORAL | 1 refills | Status: DC | PRN
  Filled 2023-04-11 – 2023-07-18 (×4): qty 180, 90d supply, fill #0

## 2023-04-11 MED ORDER — ZOLPIDEM TARTRATE 10 MG PO TABS
10.0000 mg | ORAL_TABLET | Freq: Every day | ORAL | 1 refills | Status: DC
  Filled 2023-04-11 – 2023-04-20 (×2): qty 90, 90d supply, fill #0
  Filled 2023-07-12: qty 90, 90d supply, fill #1

## 2023-04-11 MED ORDER — LINZESS 145 MCG PO CAPS
145.0000 ug | ORAL_CAPSULE | Freq: Every day | ORAL | 3 refills | Status: DC | PRN
  Filled 2023-04-11: qty 90, 90d supply, fill #0
  Filled 2023-07-12: qty 90, 90d supply, fill #1
  Filled 2023-10-19: qty 90, 90d supply, fill #2
  Filled 2024-01-16: qty 90, 90d supply, fill #3

## 2023-04-11 MED ORDER — BUPROPION HCL ER (XL) 300 MG PO TB24
300.0000 mg | ORAL_TABLET | Freq: Every morning | ORAL | 3 refills | Status: DC
  Filled 2023-04-11: qty 90, 90d supply, fill #0
  Filled 2023-07-12: qty 90, 90d supply, fill #1
  Filled 2023-08-31 – 2023-10-19 (×2): qty 90, 90d supply, fill #2
  Filled 2024-01-16: qty 90, 90d supply, fill #3

## 2023-04-11 NOTE — Telephone Encounter (Signed)
Patient left message wanting to set up an appointment with Dr. Dallas Schimke for an knee injection. Called patient and left message for her to call office back so we can get her scheduled.

## 2023-04-16 ENCOUNTER — Ambulatory Visit (INDEPENDENT_AMBULATORY_CARE_PROVIDER_SITE_OTHER): Payer: Commercial Managed Care - PPO | Admitting: Orthopedic Surgery

## 2023-04-16 ENCOUNTER — Encounter: Payer: Self-pay | Admitting: Orthopedic Surgery

## 2023-04-16 DIAGNOSIS — M25561 Pain in right knee: Secondary | ICD-10-CM

## 2023-04-16 DIAGNOSIS — G8929 Other chronic pain: Secondary | ICD-10-CM

## 2023-04-16 NOTE — Progress Notes (Signed)
New Patient Visit  Assessment: [name] is a 52 y.o. female with the following: Right knee pain; MRI documented anterior horn lateral meniscus tear, with medial based knee pain  Plan: [name] has recurrence of her right knee pain.  She states that she has fallen a couple times recently.  Pain is primarily medial aspect of the knee.  Some pain laterally.  She also some pain in the posterior aspect of her knee.  Prior injection was very helpful.  She would like to proceed with an injection today.  This was completed without issues.  She will follow-up as needed.  Procedure note injection Right knee joint   Verbal consent was obtained to inject the right knee joint  Timeout was completed to confirm the site of injection.  The skin was prepped with alcohol and ethyl chloride was sprayed at the injection site.  A 21-gauge needle was used to inject 40 mg of Depo-Medrol and 1% lidocaine (3 cc) into the right knee using an anterolateral approach.  There were no complications. A sterile bandage was applied.    Follow-up: Return if symptoms worsen or fail to improve.  Subjective:  Chief Complaint  Patient presents with   Knee Pain    R knee pain  NDC [phone]    History of Present Illness: [name] is a 53 y.o. female who returns for repeat evaluation of right knee pain.  I have previously seen her for her right knee.  She does have an anterior horn, lateral meniscus tear.  She has previously had right knee injections, and these have been very effective.  Recently, she was running in a 5K, when she fell.  Since then, her pain is worsening.    Review of Systems: No fevers or chills No numbness or tingling No chest pain No shortness of breath No bowel or bladder dysfunction No GI distress No headaches     Objective: LMP 09/18/2017   Physical Exam:  General: Alert and oriented. and No acute distress. Gait: Right sided antalgic gait.  Right knee without  obvious effusion.  Well-healed surgical incisions.  She tolerates range of motion from full extension, to 110 degrees of flexion.  Negative Lachman.  No increased laxity to varus or valgus stress.  Tenderness to palpation over the medial joint line.   IMAGING: I personally reviewed images previously obtained in clinic  No new imaging obtained today.  New Medications:  No orders of the defined types were placed in this encounter.      Andrea Barre, MD  04/16/2023 11:54 AM

## 2023-04-16 NOTE — Patient Instructions (Signed)

## 2023-04-21 ENCOUNTER — Other Ambulatory Visit: Payer: Self-pay

## 2023-04-21 ENCOUNTER — Encounter: Payer: Self-pay | Admitting: Orthopedic Surgery

## 2023-04-23 ENCOUNTER — Other Ambulatory Visit (HOSPITAL_BASED_OUTPATIENT_CLINIC_OR_DEPARTMENT_OTHER): Payer: Self-pay

## 2023-04-23 ENCOUNTER — Other Ambulatory Visit (HOSPITAL_COMMUNITY): Payer: Self-pay

## 2023-04-23 MED ORDER — SERTRALINE HCL 100 MG PO TABS
100.0000 mg | ORAL_TABLET | Freq: Every day | ORAL | 1 refills | Status: DC
  Filled 2023-04-23: qty 90, 90d supply, fill #0
  Filled 2023-07-22: qty 90, 90d supply, fill #1

## 2023-05-06 DIAGNOSIS — Z9884 Bariatric surgery status: Secondary | ICD-10-CM | POA: Diagnosis not present

## 2023-05-06 DIAGNOSIS — Z8639 Personal history of other endocrine, nutritional and metabolic disease: Secondary | ICD-10-CM | POA: Diagnosis not present

## 2023-05-06 DIAGNOSIS — L987 Excessive and redundant skin and subcutaneous tissue: Secondary | ICD-10-CM | POA: Diagnosis not present

## 2023-05-06 DIAGNOSIS — Z6824 Body mass index (BMI) 24.0-24.9, adult: Secondary | ICD-10-CM | POA: Diagnosis not present

## 2023-05-06 DIAGNOSIS — R632 Polyphagia: Secondary | ICD-10-CM | POA: Diagnosis not present

## 2023-05-08 ENCOUNTER — Other Ambulatory Visit (HOSPITAL_COMMUNITY): Payer: Self-pay

## 2023-05-08 MED ORDER — PHENTERMINE HCL 37.5 MG PO TABS
18.7200 mg | ORAL_TABLET | Freq: Two times a day (BID) | ORAL | 0 refills | Status: DC
  Filled 2023-05-08: qty 90, 90d supply, fill #0

## 2023-06-10 ENCOUNTER — Other Ambulatory Visit (HOSPITAL_COMMUNITY): Payer: Self-pay

## 2023-06-10 DIAGNOSIS — R632 Polyphagia: Secondary | ICD-10-CM | POA: Diagnosis not present

## 2023-06-10 DIAGNOSIS — L987 Excessive and redundant skin and subcutaneous tissue: Secondary | ICD-10-CM | POA: Diagnosis not present

## 2023-06-10 DIAGNOSIS — Z9884 Bariatric surgery status: Secondary | ICD-10-CM | POA: Diagnosis not present

## 2023-06-10 DIAGNOSIS — Z6822 Body mass index (BMI) 22.0-22.9, adult: Secondary | ICD-10-CM | POA: Diagnosis not present

## 2023-06-10 DIAGNOSIS — Z8639 Personal history of other endocrine, nutritional and metabolic disease: Secondary | ICD-10-CM | POA: Diagnosis not present

## 2023-06-10 DIAGNOSIS — E538 Deficiency of other specified B group vitamins: Secondary | ICD-10-CM | POA: Diagnosis not present

## 2023-06-10 MED ORDER — PHENTERMINE HCL 37.5 MG PO TABS
18.7500 mg | ORAL_TABLET | Freq: Two times a day (BID) | ORAL | 0 refills | Status: DC
  Filled 2023-06-10 – 2023-09-30 (×3): qty 90, 90d supply, fill #0

## 2023-06-11 ENCOUNTER — Other Ambulatory Visit (HOSPITAL_COMMUNITY): Payer: Self-pay

## 2023-07-01 ENCOUNTER — Encounter (HOSPITAL_COMMUNITY): Payer: Self-pay | Admitting: *Deleted

## 2023-07-12 ENCOUNTER — Other Ambulatory Visit (HOSPITAL_COMMUNITY): Payer: Self-pay

## 2023-07-13 ENCOUNTER — Other Ambulatory Visit (HOSPITAL_COMMUNITY): Payer: Self-pay

## 2023-07-14 ENCOUNTER — Other Ambulatory Visit: Payer: Self-pay

## 2023-07-14 ENCOUNTER — Other Ambulatory Visit (HOSPITAL_COMMUNITY): Payer: Self-pay

## 2023-07-18 ENCOUNTER — Other Ambulatory Visit (HOSPITAL_COMMUNITY): Payer: Self-pay

## 2023-07-23 ENCOUNTER — Other Ambulatory Visit (HOSPITAL_COMMUNITY): Payer: Self-pay

## 2023-08-13 DIAGNOSIS — F418 Other specified anxiety disorders: Secondary | ICD-10-CM | POA: Diagnosis not present

## 2023-08-13 DIAGNOSIS — Z6823 Body mass index (BMI) 23.0-23.9, adult: Secondary | ICD-10-CM | POA: Diagnosis not present

## 2023-08-13 DIAGNOSIS — R632 Polyphagia: Secondary | ICD-10-CM | POA: Diagnosis not present

## 2023-08-13 DIAGNOSIS — Z8639 Personal history of other endocrine, nutritional and metabolic disease: Secondary | ICD-10-CM | POA: Diagnosis not present

## 2023-08-13 DIAGNOSIS — L987 Excessive and redundant skin and subcutaneous tissue: Secondary | ICD-10-CM | POA: Diagnosis not present

## 2023-08-13 DIAGNOSIS — G43019 Migraine without aura, intractable, without status migrainosus: Secondary | ICD-10-CM | POA: Diagnosis not present

## 2023-08-15 DIAGNOSIS — E65 Localized adiposity: Secondary | ICD-10-CM | POA: Diagnosis not present

## 2023-08-16 ENCOUNTER — Other Ambulatory Visit (HOSPITAL_COMMUNITY): Payer: Self-pay

## 2023-08-16 MED ORDER — QULIPTA 60 MG PO TABS
60.0000 mg | ORAL_TABLET | Freq: Every day | ORAL | 0 refills | Status: DC
Start: 2023-08-14 — End: 2023-11-18
  Filled 2023-08-16: qty 30, 30d supply, fill #0

## 2023-08-30 ENCOUNTER — Encounter (HOSPITAL_COMMUNITY): Payer: Self-pay

## 2023-08-31 ENCOUNTER — Other Ambulatory Visit (HOSPITAL_COMMUNITY): Payer: Self-pay

## 2023-09-01 ENCOUNTER — Other Ambulatory Visit (HOSPITAL_COMMUNITY): Payer: Self-pay

## 2023-09-02 ENCOUNTER — Other Ambulatory Visit (HOSPITAL_BASED_OUTPATIENT_CLINIC_OR_DEPARTMENT_OTHER): Payer: Self-pay

## 2023-09-02 ENCOUNTER — Other Ambulatory Visit (HOSPITAL_COMMUNITY): Payer: Self-pay

## 2023-09-03 ENCOUNTER — Encounter (HOSPITAL_COMMUNITY): Payer: Self-pay

## 2023-09-03 ENCOUNTER — Other Ambulatory Visit (HOSPITAL_COMMUNITY): Payer: Self-pay

## 2023-09-22 ENCOUNTER — Other Ambulatory Visit: Payer: Self-pay

## 2023-09-23 ENCOUNTER — Other Ambulatory Visit: Payer: Self-pay

## 2023-09-24 ENCOUNTER — Other Ambulatory Visit: Payer: Self-pay

## 2023-09-30 ENCOUNTER — Other Ambulatory Visit (HOSPITAL_BASED_OUTPATIENT_CLINIC_OR_DEPARTMENT_OTHER): Payer: Self-pay

## 2023-09-30 ENCOUNTER — Other Ambulatory Visit (HOSPITAL_COMMUNITY): Payer: Self-pay

## 2023-10-03 ENCOUNTER — Other Ambulatory Visit (HOSPITAL_COMMUNITY): Payer: Self-pay

## 2023-10-06 ENCOUNTER — Other Ambulatory Visit: Payer: Self-pay

## 2023-10-06 ENCOUNTER — Other Ambulatory Visit (HOSPITAL_COMMUNITY): Payer: Self-pay

## 2023-10-09 ENCOUNTER — Other Ambulatory Visit (HOSPITAL_COMMUNITY): Payer: Self-pay

## 2023-10-09 DIAGNOSIS — F3341 Major depressive disorder, recurrent, in partial remission: Secondary | ICD-10-CM | POA: Diagnosis not present

## 2023-10-09 DIAGNOSIS — F419 Anxiety disorder, unspecified: Secondary | ICD-10-CM | POA: Diagnosis not present

## 2023-10-09 DIAGNOSIS — E7800 Pure hypercholesterolemia, unspecified: Secondary | ICD-10-CM | POA: Diagnosis not present

## 2023-10-09 DIAGNOSIS — F5101 Primary insomnia: Secondary | ICD-10-CM | POA: Diagnosis not present

## 2023-10-09 DIAGNOSIS — Z9884 Bariatric surgery status: Secondary | ICD-10-CM | POA: Diagnosis not present

## 2023-10-09 MED ORDER — TRAZODONE HCL 50 MG PO TABS
50.0000 mg | ORAL_TABLET | Freq: Every day | ORAL | 1 refills | Status: DC
  Filled 2023-10-09: qty 90, 90d supply, fill #0
  Filled 2024-01-09: qty 90, 90d supply, fill #1

## 2023-10-19 ENCOUNTER — Other Ambulatory Visit (HOSPITAL_COMMUNITY): Payer: Self-pay

## 2023-10-20 ENCOUNTER — Other Ambulatory Visit (HOSPITAL_COMMUNITY): Payer: Self-pay

## 2023-10-21 ENCOUNTER — Other Ambulatory Visit: Payer: Self-pay

## 2023-10-21 ENCOUNTER — Other Ambulatory Visit (HOSPITAL_COMMUNITY): Payer: Self-pay

## 2023-10-21 MED ORDER — SERTRALINE HCL 100 MG PO TABS
100.0000 mg | ORAL_TABLET | Freq: Every day | ORAL | 0 refills | Status: DC
  Filled 2023-10-21: qty 90, 90d supply, fill #0

## 2023-10-21 MED ORDER — NYSTATIN 100000 UNIT/GM EX OINT
TOPICAL_OINTMENT | CUTANEOUS | 2 refills | Status: DC
  Filled 2023-10-21: qty 30, 30d supply, fill #0
  Filled 2024-01-16: qty 15, 7d supply, fill #1

## 2023-10-21 MED ORDER — ALPRAZOLAM 0.5 MG PO TABS
0.5000 mg | ORAL_TABLET | Freq: Two times a day (BID) | ORAL | 0 refills | Status: DC | PRN
  Filled 2023-10-21: qty 180, 90d supply, fill #0

## 2023-10-21 MED ORDER — ZOLPIDEM TARTRATE 10 MG PO TABS
10.0000 mg | ORAL_TABLET | Freq: Every day | ORAL | 0 refills | Status: DC
  Filled 2023-10-21: qty 90, 90d supply, fill #0

## 2023-10-22 ENCOUNTER — Other Ambulatory Visit (HOSPITAL_COMMUNITY): Payer: Self-pay

## 2023-11-17 ENCOUNTER — Other Ambulatory Visit (HOSPITAL_COMMUNITY): Payer: Self-pay

## 2023-11-18 ENCOUNTER — Other Ambulatory Visit (HOSPITAL_COMMUNITY): Payer: Self-pay

## 2023-11-18 MED ORDER — QULIPTA 60 MG PO TABS
1.0000 | ORAL_TABLET | Freq: Every day | ORAL | 0 refills | Status: DC
  Filled 2023-11-18: qty 30, 30d supply, fill #0

## 2023-12-04 ENCOUNTER — Ambulatory Visit
Admission: RE | Admit: 2023-12-04 | Discharge: 2023-12-04 | Disposition: A | Discharge status: Home / Self Care | Payer: Commercial Managed Care - PPO | Source: Ambulatory Visit | Attending: Nurse Practitioner | Admitting: Nurse Practitioner

## 2023-12-04 VITALS — BP 106/72 | HR 77 | Temp 98.1°F | Resp 18

## 2023-12-04 DIAGNOSIS — U071 COVID-19: Secondary | ICD-10-CM

## 2023-12-04 LAB — POC COVID19/FLU A&B COMBO
Covid Antigen, POC: POSITIVE — AB
Influenza A Antigen, POC: NEGATIVE
Influenza B Antigen, POC: NEGATIVE

## 2023-12-04 MED ORDER — ALBUTEROL SULFATE HFA 108 (90 BASE) MCG/ACT IN AERS: 1.0000 | INHALATION_SPRAY | Freq: Four times a day (QID) | RESPIRATORY_TRACT | 0 refills | Status: DC | PRN

## 2023-12-04 MED ORDER — BENZONATATE 100 MG PO CAPS: 100.0000 mg | ORAL_CAPSULE | Freq: Three times a day (TID) | ORAL | 0 refills | Status: DC | PRN

## 2023-12-04 MED ORDER — PAXLOVID (300/100) 20 X 150 MG & 10 X 100MG PO TBPK: 3.0000 | ORAL_TABLET | Freq: Two times a day (BID) | ORAL | 0 refills | Status: AC
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
## 2023-12-04 NOTE — Discharge Instructions (Signed)
You have COVID-19.  Symptoms should improve over the next week to 10 days.  If you develop chest pain or shortness of breath, go to the emergency room.  Some things that can make you feel better are: - Increased rest - Increasing fluid with water/sugar free electrolytes - Acetaminophen and ibuprofen as needed for fever/pain - Salt water gargling, chloraseptic spray and throat lozenges - OTC guaifenesin (Mucinex) 600 mg twice daily - Saline sinus flushes or a neti pot - Humidifying the air -Tessalon Perles every 8 hours as needed for dry cough and albuterol inhaler as needed

## 2023-12-04 NOTE — ED Triage Notes (Signed)
Pt reports she has nasal congestion, throat drainage, and "cannot take a deep breath" x 3 days

## 2023-12-23 DIAGNOSIS — G43019 Migraine without aura, intractable, without status migrainosus: Secondary | ICD-10-CM | POA: Diagnosis not present

## 2023-12-23 DIAGNOSIS — Z8639 Personal history of other endocrine, nutritional and metabolic disease: Secondary | ICD-10-CM | POA: Diagnosis not present

## 2023-12-23 DIAGNOSIS — Z6824 Body mass index (BMI) 24.0-24.9, adult: Secondary | ICD-10-CM | POA: Diagnosis not present

## 2023-12-23 DIAGNOSIS — G4709 Other insomnia: Secondary | ICD-10-CM | POA: Diagnosis not present

## 2023-12-23 DIAGNOSIS — F411 Generalized anxiety disorder: Secondary | ICD-10-CM | POA: Diagnosis not present

## 2023-12-24 ENCOUNTER — Other Ambulatory Visit (HOSPITAL_COMMUNITY): Payer: Self-pay

## 2023-12-24 ENCOUNTER — Other Ambulatory Visit: Payer: Self-pay

## 2023-12-24 MED ORDER — QULIPTA 60 MG PO TABS
60.0000 mg | ORAL_TABLET | Freq: Every day | ORAL | 0 refills | Status: DC
  Filled 2023-12-24: qty 90, 90d supply, fill #0

## 2023-12-24 MED ORDER — MOUNJARO 15 MG/0.5ML ~~LOC~~ SOAJ
15.0000 mg | SUBCUTANEOUS | 0 refills | Status: DC
  Filled 2023-12-24: qty 2, 28d supply, fill #0

## 2023-12-26 ENCOUNTER — Other Ambulatory Visit: Payer: Self-pay

## 2023-12-31 ENCOUNTER — Other Ambulatory Visit (HOSPITAL_COMMUNITY): Payer: Self-pay

## 2024-01-01 ENCOUNTER — Other Ambulatory Visit: Payer: Self-pay

## 2024-01-10 ENCOUNTER — Other Ambulatory Visit (HOSPITAL_COMMUNITY): Payer: Self-pay

## 2024-01-16 ENCOUNTER — Other Ambulatory Visit (HOSPITAL_COMMUNITY): Payer: Self-pay

## 2024-01-16 ENCOUNTER — Other Ambulatory Visit: Payer: Self-pay

## 2024-01-19 ENCOUNTER — Other Ambulatory Visit (HOSPITAL_COMMUNITY): Payer: Self-pay

## 2024-01-19 ENCOUNTER — Other Ambulatory Visit: Payer: Self-pay

## 2024-01-19 DIAGNOSIS — M25571 Pain in right ankle and joints of right foot: Secondary | ICD-10-CM | POA: Insufficient documentation

## 2024-01-19 MED ORDER — PHENTERMINE HCL 37.5 MG PO TABS
18.7500 mg | ORAL_TABLET | Freq: Two times a day (BID) | ORAL | 0 refills | Status: DC
  Filled 2024-01-19: qty 90, 90d supply, fill #0

## 2024-01-19 MED ORDER — SERTRALINE HCL 100 MG PO TABS
100.0000 mg | ORAL_TABLET | Freq: Every day | ORAL | 0 refills | Status: DC
  Filled 2024-01-19: qty 90, 90d supply, fill #0

## 2024-01-20 ENCOUNTER — Other Ambulatory Visit (HOSPITAL_COMMUNITY): Payer: Self-pay

## 2024-01-20 ENCOUNTER — Other Ambulatory Visit: Payer: Self-pay

## 2024-01-20 MED ORDER — ZOLPIDEM TARTRATE 10 MG PO TABS
10.0000 mg | ORAL_TABLET | Freq: Every day | ORAL | 0 refills | Status: DC
  Filled 2024-01-20 (×2): qty 90, 90d supply, fill #0

## 2024-01-20 MED ORDER — ALPRAZOLAM 0.5 MG PO TABS
0.5000 mg | ORAL_TABLET | Freq: Two times a day (BID) | ORAL | 0 refills | Status: DC
  Filled 2024-01-20 (×2): qty 180, 90d supply, fill #0

## 2024-01-31 DIAGNOSIS — H524 Presbyopia: Secondary | ICD-10-CM | POA: Diagnosis not present

## 2024-02-02 DIAGNOSIS — F418 Other specified anxiety disorders: Secondary | ICD-10-CM | POA: Diagnosis not present

## 2024-02-02 DIAGNOSIS — M25571 Pain in right ankle and joints of right foot: Secondary | ICD-10-CM | POA: Diagnosis not present

## 2024-02-02 DIAGNOSIS — R632 Polyphagia: Secondary | ICD-10-CM | POA: Diagnosis not present

## 2024-02-02 DIAGNOSIS — Z8639 Personal history of other endocrine, nutritional and metabolic disease: Secondary | ICD-10-CM | POA: Diagnosis not present

## 2024-02-02 DIAGNOSIS — Z6824 Body mass index (BMI) 24.0-24.9, adult: Secondary | ICD-10-CM | POA: Diagnosis not present

## 2024-02-02 DIAGNOSIS — Z9884 Bariatric surgery status: Secondary | ICD-10-CM | POA: Diagnosis not present

## 2024-02-02 DIAGNOSIS — S93491D Sprain of other ligament of right ankle, subsequent encounter: Secondary | ICD-10-CM | POA: Diagnosis not present

## 2024-02-24 ENCOUNTER — Other Ambulatory Visit (HOSPITAL_COMMUNITY): Payer: Self-pay

## 2024-03-19 DIAGNOSIS — E559 Vitamin D deficiency, unspecified: Secondary | ICD-10-CM | POA: Diagnosis not present

## 2024-03-19 DIAGNOSIS — F5101 Primary insomnia: Secondary | ICD-10-CM | POA: Diagnosis not present

## 2024-03-19 DIAGNOSIS — Z8639 Personal history of other endocrine, nutritional and metabolic disease: Secondary | ICD-10-CM | POA: Diagnosis not present

## 2024-03-19 DIAGNOSIS — E782 Mixed hyperlipidemia: Secondary | ICD-10-CM | POA: Diagnosis not present

## 2024-03-19 DIAGNOSIS — F3341 Major depressive disorder, recurrent, in partial remission: Secondary | ICD-10-CM | POA: Diagnosis not present

## 2024-03-19 DIAGNOSIS — R632 Polyphagia: Secondary | ICD-10-CM | POA: Diagnosis not present

## 2024-03-29 ENCOUNTER — Ambulatory Visit
Admission: RE | Admit: 2024-03-29 | Discharge: 2024-03-29 | Disposition: A | Discharge status: Home / Self Care | Source: Ambulatory Visit | Attending: Nurse Practitioner | Admitting: Nurse Practitioner

## 2024-03-29 VITALS — BP 102/68 | HR 77 | Temp 98.9°F | Resp 16

## 2024-03-29 DIAGNOSIS — W57XXXA Bitten or stung by nonvenomous insect and other nonvenomous arthropods, initial encounter: Secondary | ICD-10-CM | POA: Diagnosis not present

## 2024-03-29 DIAGNOSIS — S20169A Insect bite (nonvenomous) of breast, unspecified breast, initial encounter: Secondary | ICD-10-CM | POA: Diagnosis not present

## 2024-03-29 DIAGNOSIS — U071 COVID-19: Secondary | ICD-10-CM

## 2024-03-29 MED ORDER — BENZONATATE 100 MG PO CAPS: 100.0000 mg | ORAL_CAPSULE | Freq: Three times a day (TID) | ORAL | 0 refills | Status: DC | PRN

## 2024-03-29 MED ORDER — PAXLOVID (300/100) 20 X 150 MG & 10 X 100MG PO TBPK: 3.0000 | ORAL_TABLET | Freq: Two times a day (BID) | ORAL | 0 refills | Status: AC

## 2024-03-29 MED ORDER — DOXYCYCLINE HYCLATE 100 MG PO CAPS: 200.0000 mg | ORAL_CAPSULE | Freq: Once | ORAL | 0 refills | Status: AC

## 2024-03-29 NOTE — Discharge Instructions (Signed)
 Take the doxycycline 200 mg once to treat/prevent tick borne illness.  Please seek care if you develop red circular rash with white middle, new fever, new body aches/chills, new joint pain, headache, or neck stiffness.  Your COVID-19 symptoms are considered mild.  Take the Paxlovid twice daily for 5 days to treat it.  Symptoms should improve over the next week to 10 days.  If you develop chest pain or shortness of breath, go to the emergency room.  We have tested you today for COVID-19.  You will see the results in Mychart and we will call you with positive results.  Please stay home and isolate until you are aware of the results.    Some things that can make you feel better are: - Increased rest - Increasing fluid with water/sugar free electrolytes - Acetaminophen and ibuprofen as needed for fever/pain - Salt water gargling, chloraseptic spray and throat lozenges - OTC guaifenesin (Mucinex) 600 mg twice daily for congestion - Saline sinus flushes or a neti pot - Humidifying the air -Tessalon Perles every 8 hours as needed for dry cough

## 2024-03-29 NOTE — ED Triage Notes (Signed)
 Pt reports Positive for COVID, cough, thick mucus, two tick bites under the breast and one on the neck all itching.
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
## 2024-04-14 ENCOUNTER — Other Ambulatory Visit (HOSPITAL_COMMUNITY): Payer: Self-pay

## 2024-04-15 ENCOUNTER — Other Ambulatory Visit (HOSPITAL_COMMUNITY): Payer: Self-pay

## 2024-04-15 ENCOUNTER — Other Ambulatory Visit: Payer: Self-pay

## 2024-04-15 MED ORDER — TRAZODONE HCL 50 MG PO TABS
50.0000 mg | ORAL_TABLET | Freq: Every day | ORAL | 0 refills | Status: DC | PRN
  Filled 2024-04-15: qty 90, 90d supply, fill #0

## 2024-04-15 MED ORDER — ALPRAZOLAM 0.5 MG PO TABS
0.5000 mg | ORAL_TABLET | Freq: Two times a day (BID) | ORAL | 0 refills | Status: DC
  Filled 2024-04-15: qty 180, 90d supply, fill #0

## 2024-04-15 MED ORDER — SERTRALINE HCL 100 MG PO TABS
100.0000 mg | ORAL_TABLET | Freq: Every day | ORAL | 0 refills | Status: DC
  Filled 2024-04-15: qty 90, 90d supply, fill #0

## 2024-04-15 MED ORDER — ZOLPIDEM TARTRATE 10 MG PO TABS
10.0000 mg | ORAL_TABLET | Freq: Every day | ORAL | 0 refills | Status: DC
  Filled 2024-04-15: qty 90, 90d supply, fill #0

## 2024-04-15 MED ORDER — BUPROPION HCL ER (XL) 300 MG PO TB24
300.0000 mg | ORAL_TABLET | Freq: Every morning | ORAL | 0 refills | Status: DC
  Filled 2024-04-15: qty 90, 90d supply, fill #0

## 2024-04-16 ENCOUNTER — Other Ambulatory Visit (HOSPITAL_COMMUNITY): Payer: Self-pay

## 2024-04-16 ENCOUNTER — Other Ambulatory Visit: Payer: Self-pay

## 2024-04-16 MED ORDER — LINZESS 72 MCG PO CAPS
72.0000 ug | ORAL_CAPSULE | Freq: Every day | ORAL | 0 refills | Status: DC | PRN
Start: 2024-04-16 — End: 2024-07-15
  Filled 2024-04-16: qty 30, 30d supply, fill #0

## 2024-04-16 MED ORDER — QULIPTA 60 MG PO TABS
60.0000 mg | ORAL_TABLET | Freq: Every day | ORAL | 0 refills | Status: AC
  Filled 2024-04-16 – 2024-07-14 (×3): qty 90, 90d supply, fill #0
  Filled 2024-07-16: qty 30, 30d supply, fill #0
  Filled 2024-07-28 – 2024-09-15 (×6): qty 90, 90d supply, fill #0

## 2024-04-16 MED ORDER — PHENTERMINE HCL 37.5 MG PO TABS
18.7500 mg | ORAL_TABLET | Freq: Two times a day (BID) | ORAL | 0 refills | Status: DC
  Filled 2024-04-16: qty 90, 90d supply, fill #0

## 2024-04-20 ENCOUNTER — Other Ambulatory Visit (HOSPITAL_COMMUNITY): Payer: Self-pay

## 2024-04-21 ENCOUNTER — Other Ambulatory Visit (HOSPITAL_COMMUNITY): Payer: Self-pay

## 2024-04-22 ENCOUNTER — Other Ambulatory Visit: Payer: Self-pay

## 2024-04-22 ENCOUNTER — Other Ambulatory Visit (HOSPITAL_COMMUNITY): Payer: Self-pay

## 2024-04-29 IMAGING — MG MM DIGITAL SCREENING BILAT W/ TOMO AND CAD
8 series · 8 of 24 positions shown · non-contrast
Comparison: Previous exam(s).

CLINICAL DATA: Screening.

EXAM:
DIGITAL SCREENING BILATERAL MAMMOGRAM WITH TOMOSYNTHESIS AND CAD
TECHNIQUE: Bilateral screening digital craniocaudal and mediolateral oblique
mammograms were obtained. Bilateral screening digital breast
tomosynthesis was performed. The images were evaluated with
computer-aided detection.

[L CC synth-2D]
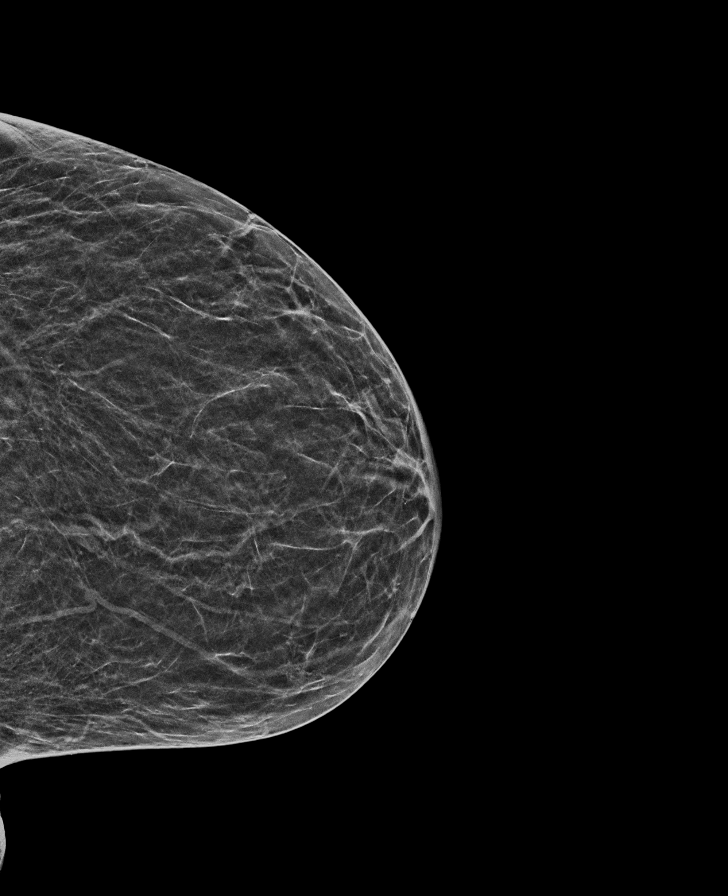

[R CC synth-2D]
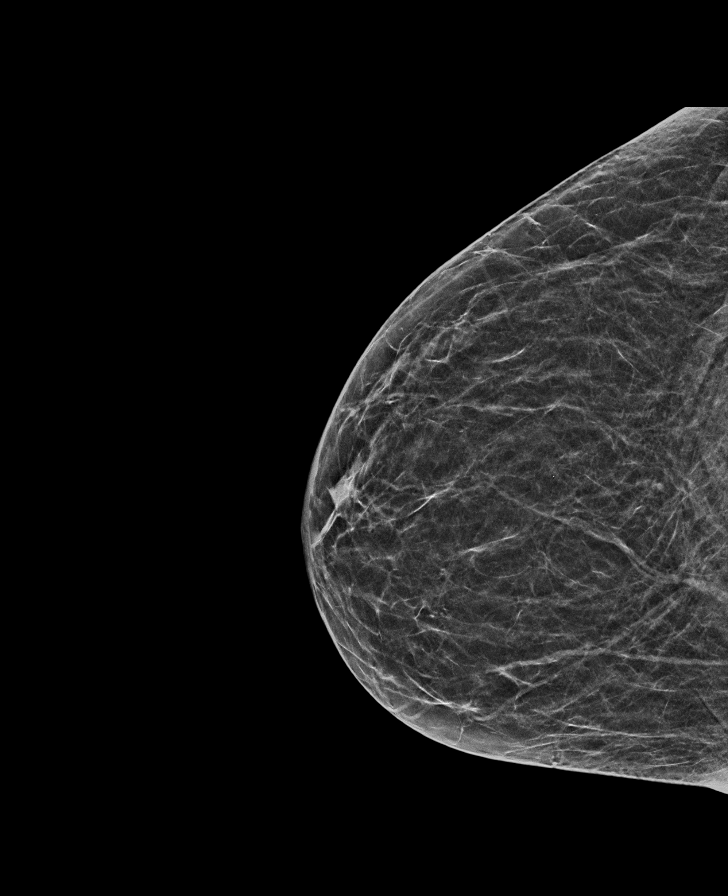

[R MLO synth-2D]
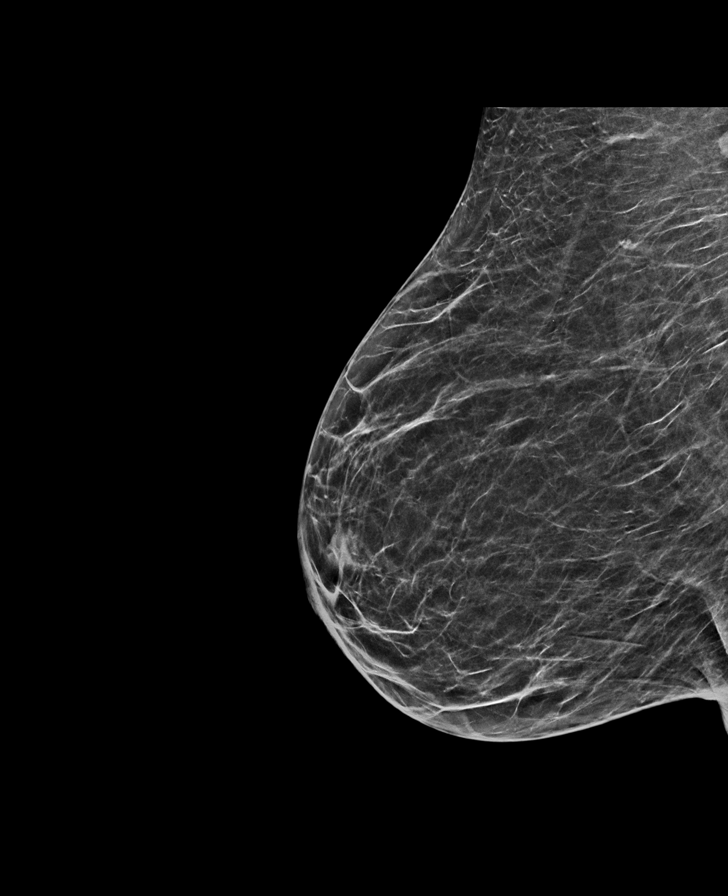

[L MLO synth-2D]
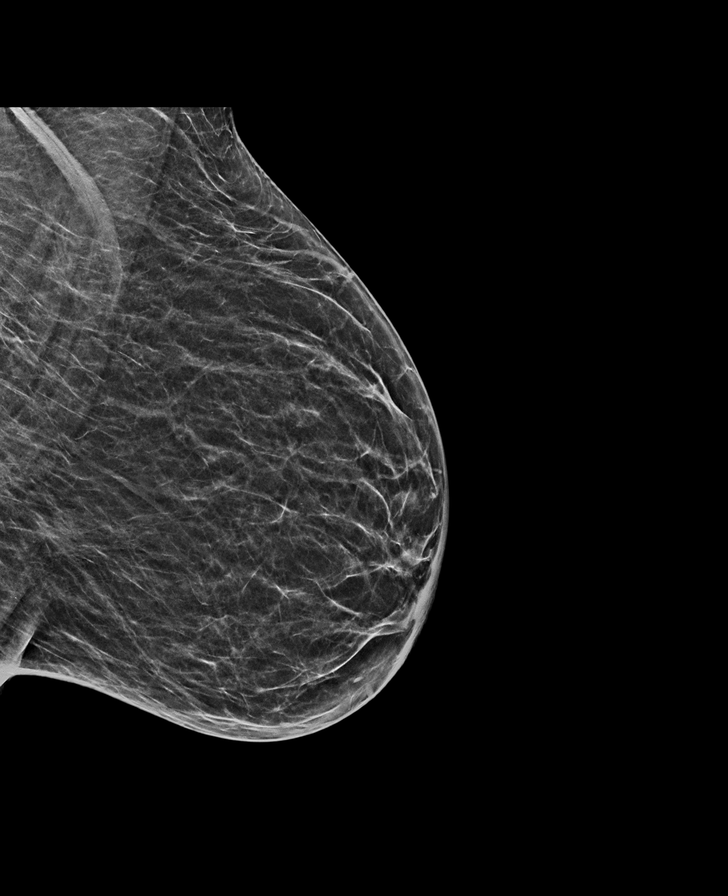

[R MLO tomo · tomo slice 27/54.0]
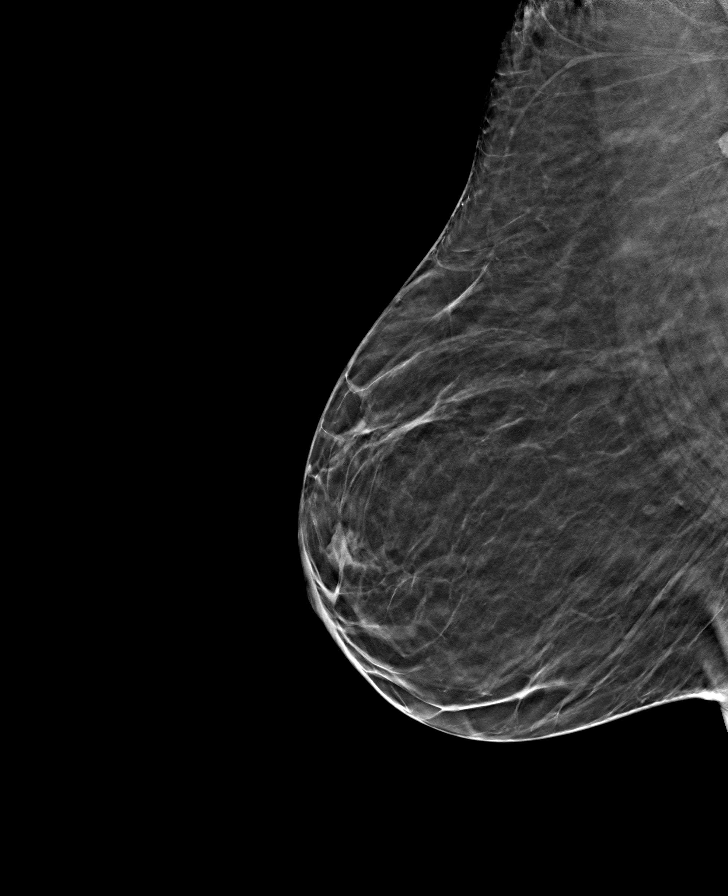

[R CC tomo · tomo slice 23/45.0]
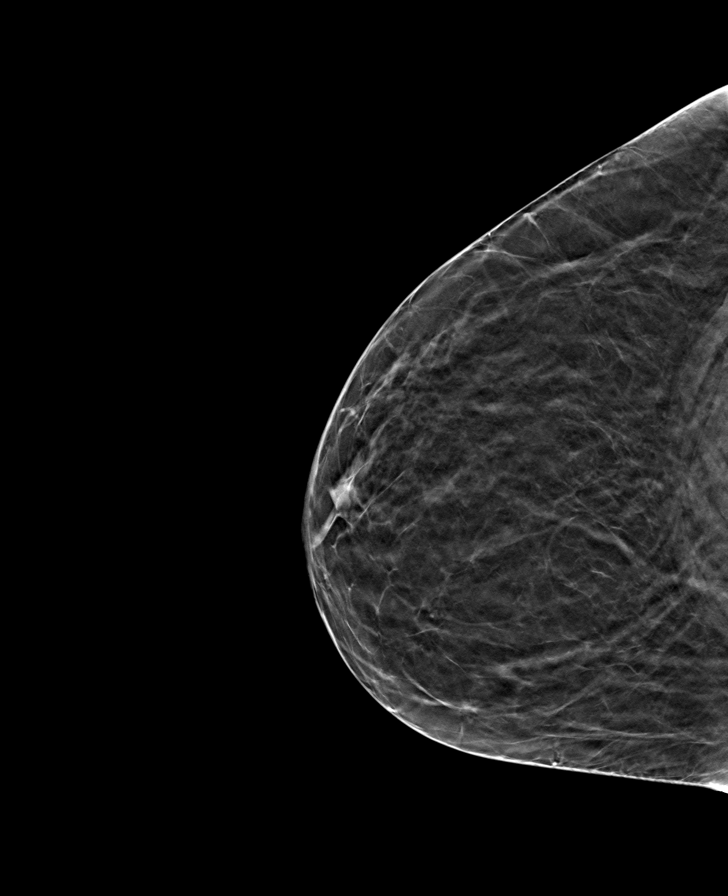

[L CC tomo · tomo slice 21/42.0]
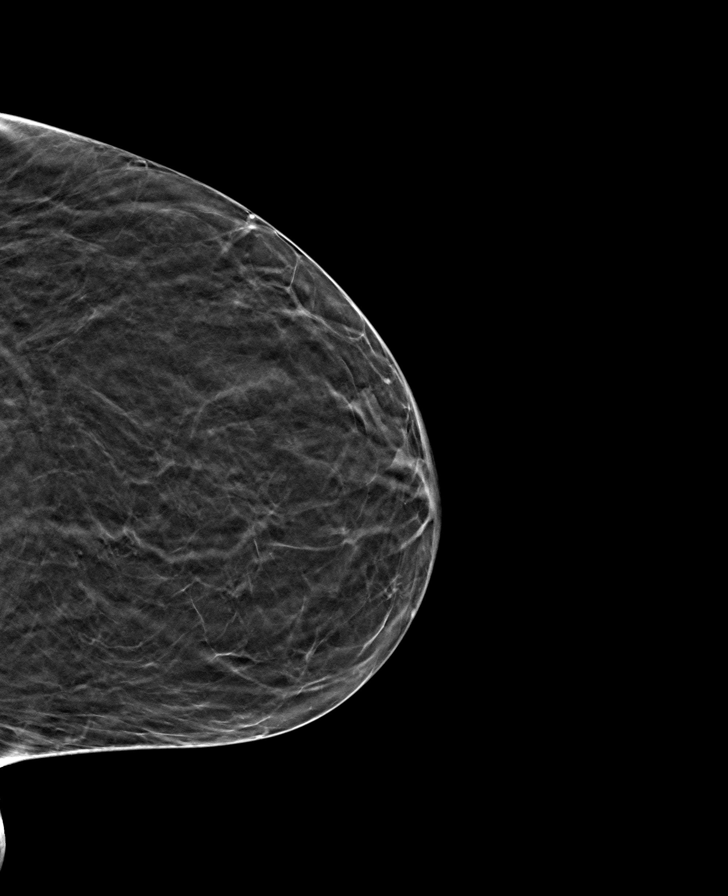

[L MLO tomo · tomo slice 25/49.0]
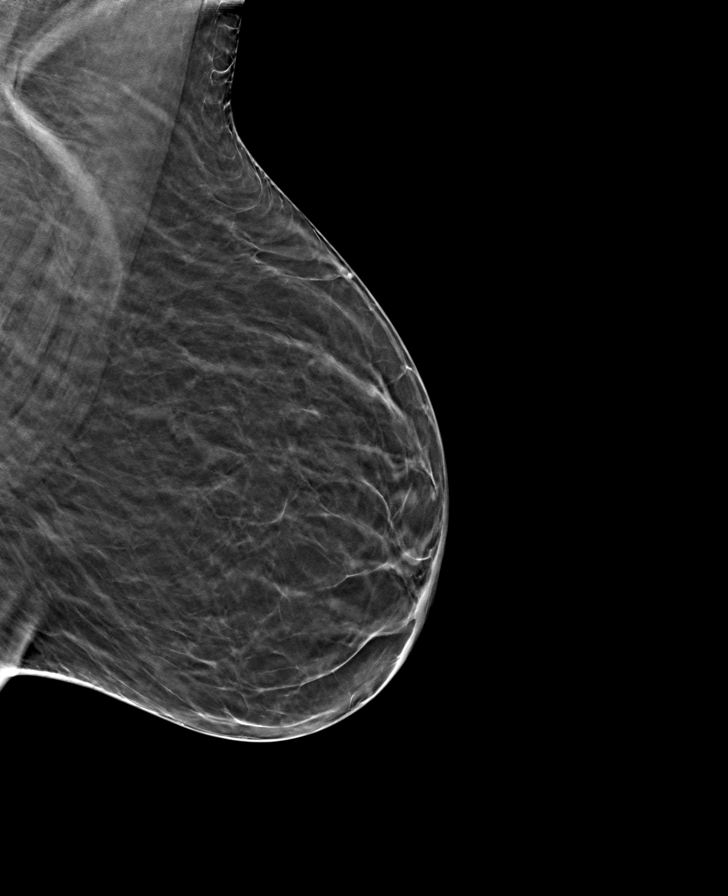

[8 of 24 positions shown; findings below may reference images not displayed]

ACR Breast Density Category b: There are scattered areas of
fibroglandular density.
FINDINGS: In the left breast, a possible asymmetry warrants further
evaluation. In the right breast, no findings suspicious for
malignancy.
IMPRESSION: Further evaluation is suggested for possible asymmetry in the left
breast.

RECOMMENDATION:
Diagnostic mammogram and possibly ultrasound of the left breast.
(Code:[id number])

The patient will be contacted regarding the findings, and additional
imaging will be scheduled.

BI-RADS CATEGORY  0: Incomplete. Need additional imaging evaluation
and/or prior mammograms for comparison.

## 2024-06-11 ENCOUNTER — Other Ambulatory Visit: Payer: Self-pay

## 2024-06-11 ENCOUNTER — Other Ambulatory Visit (HOSPITAL_COMMUNITY): Payer: Self-pay

## 2024-06-12 ENCOUNTER — Inpatient Hospital Stay
Admission: RE | Admit: 2024-06-12 | Discharge: 2024-06-12 | Discharge status: Home / Self Care | Source: Ambulatory Visit | Attending: Family Medicine

## 2024-06-12 VITALS — BP 110/68 | HR 77 | Temp 98.8°F | Resp 16

## 2024-06-12 DIAGNOSIS — S90512A Abrasion, left ankle, initial encounter: Secondary | ICD-10-CM

## 2024-06-12 DIAGNOSIS — L03116 Cellulitis of left lower limb: Secondary | ICD-10-CM

## 2024-06-12 MED ORDER — CEPHALEXIN 500 MG PO CAPS: 500.0000 mg | ORAL_CAPSULE | Freq: Two times a day (BID) | ORAL | 0 refills | Status: DC
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
## 2024-06-12 NOTE — Discharge Instructions (Signed)
 Clean the area at least once a day with Hibiclens  and apply Neosporin or Vaseline as well as a nonstick dressing.  Elevate at rest to help with swelling.  Take the full course of antibiotics and follow-up if worsening or not resolving

## 2024-06-12 NOTE — ED Triage Notes (Signed)
 Pt reports fall on Monday, had abrasion the the lateral side of the left ankle, has kept the area cleaned and covered since abrasion occurred and started using triple antibiotic ointment yesterday, states the area is red swollen and oozing.

## 2024-06-17 ENCOUNTER — Other Ambulatory Visit (HOSPITAL_COMMUNITY): Payer: Self-pay

## 2024-07-02 ENCOUNTER — Encounter (HOSPITAL_COMMUNITY): Payer: Self-pay | Admitting: *Deleted

## 2024-07-03 ENCOUNTER — Other Ambulatory Visit (HOSPITAL_COMMUNITY): Payer: Self-pay

## 2024-07-06 DIAGNOSIS — R632 Polyphagia: Secondary | ICD-10-CM | POA: Diagnosis not present

## 2024-07-06 DIAGNOSIS — E538 Deficiency of other specified B group vitamins: Secondary | ICD-10-CM | POA: Diagnosis not present

## 2024-07-06 DIAGNOSIS — Z8639 Personal history of other endocrine, nutritional and metabolic disease: Secondary | ICD-10-CM | POA: Diagnosis not present

## 2024-07-06 DIAGNOSIS — Z903 Acquired absence of stomach [part of]: Secondary | ICD-10-CM | POA: Diagnosis not present

## 2024-07-06 DIAGNOSIS — F411 Generalized anxiety disorder: Secondary | ICD-10-CM | POA: Diagnosis not present

## 2024-07-06 DIAGNOSIS — Z6824 Body mass index (BMI) 24.0-24.9, adult: Secondary | ICD-10-CM | POA: Diagnosis not present

## 2024-07-07 ENCOUNTER — Other Ambulatory Visit (HOSPITAL_COMMUNITY): Payer: Self-pay

## 2024-07-07 MED ORDER — PHENTERMINE HCL 37.5 MG PO TABS
18.7500 mg | ORAL_TABLET | Freq: Two times a day (BID) | ORAL | 0 refills | Status: DC
  Filled 2024-07-14 – 2024-07-19 (×2): qty 90, 90d supply, fill #0

## 2024-07-08 ENCOUNTER — Other Ambulatory Visit (HOSPITAL_COMMUNITY): Payer: Self-pay

## 2024-07-14 ENCOUNTER — Other Ambulatory Visit (HOSPITAL_COMMUNITY): Payer: Self-pay

## 2024-07-15 ENCOUNTER — Other Ambulatory Visit (HOSPITAL_BASED_OUTPATIENT_CLINIC_OR_DEPARTMENT_OTHER): Payer: Self-pay

## 2024-07-15 ENCOUNTER — Other Ambulatory Visit: Payer: Self-pay

## 2024-07-15 ENCOUNTER — Other Ambulatory Visit (HOSPITAL_COMMUNITY): Payer: Self-pay

## 2024-07-15 MED ORDER — LINZESS 72 MCG PO CAPS
72.0000 ug | ORAL_CAPSULE | Freq: Every day | ORAL | 0 refills | Status: DC | PRN
  Filled 2024-07-15: qty 90, 90d supply, fill #0

## 2024-07-15 MED ORDER — SERTRALINE HCL 100 MG PO TABS
100.0000 mg | ORAL_TABLET | Freq: Every day | ORAL | 0 refills | Status: DC
  Filled 2024-07-15: qty 90, 90d supply, fill #0

## 2024-07-16 ENCOUNTER — Other Ambulatory Visit: Payer: Self-pay

## 2024-07-16 ENCOUNTER — Other Ambulatory Visit (HOSPITAL_COMMUNITY): Payer: Self-pay

## 2024-07-16 MED ORDER — BUPROPION HCL ER (XL) 300 MG PO TB24
300.0000 mg | ORAL_TABLET | Freq: Every day | ORAL | 0 refills | Status: DC
  Filled 2024-07-16: qty 90, 90d supply, fill #0

## 2024-07-16 MED ORDER — PHENTERMINE HCL 37.5 MG PO TABS: 18.7500 mg | ORAL_TABLET | Freq: Two times a day (BID) | ORAL | 0 refills | Status: DC

## 2024-07-16 MED ORDER — SERTRALINE HCL 100 MG PO TABS
100.0000 mg | ORAL_TABLET | Freq: Every day | ORAL | 0 refills | Status: DC
  Filled 2024-07-16: qty 90, 90d supply, fill #0

## 2024-07-16 MED ORDER — TRAZODONE HCL 50 MG PO TABS
50.0000 mg | ORAL_TABLET | Freq: Every evening | ORAL | 0 refills | Status: DC | PRN
  Filled 2024-07-16: qty 90, 90d supply, fill #0

## 2024-07-17 ENCOUNTER — Other Ambulatory Visit: Payer: Self-pay

## 2024-07-19 ENCOUNTER — Encounter: Payer: Self-pay | Admitting: Pharmacist

## 2024-07-19 ENCOUNTER — Other Ambulatory Visit (HOSPITAL_COMMUNITY): Payer: Self-pay

## 2024-07-19 ENCOUNTER — Other Ambulatory Visit: Payer: Self-pay

## 2024-07-19 MED ORDER — TRAZODONE HCL 50 MG PO TABS: 50.0000 mg | ORAL_TABLET | Freq: Every evening | ORAL | 0 refills | Status: DC | PRN

## 2024-07-19 MED ORDER — NYSTATIN 100000 UNIT/GM EX OINT
1.0000 | TOPICAL_OINTMENT | Freq: Two times a day (BID) | CUTANEOUS | 2 refills | Status: AC
  Filled 2024-07-19 – 2024-11-16 (×2): qty 15, 8d supply, fill #0

## 2024-07-19 MED ORDER — LINZESS 72 MCG PO CAPS: 72.0000 ug | ORAL_CAPSULE | Freq: Every day | ORAL | 0 refills | Status: DC | PRN

## 2024-07-20 ENCOUNTER — Other Ambulatory Visit: Payer: Self-pay

## 2024-07-21 ENCOUNTER — Ambulatory Visit: Admitting: Orthopedic Surgery

## 2024-07-22 ENCOUNTER — Other Ambulatory Visit: Payer: Self-pay

## 2024-07-23 ENCOUNTER — Encounter: Payer: Self-pay | Admitting: Orthopedic Surgery

## 2024-07-23 ENCOUNTER — Ambulatory Visit: Admitting: Orthopedic Surgery

## 2024-07-23 ENCOUNTER — Ambulatory Visit (INDEPENDENT_AMBULATORY_CARE_PROVIDER_SITE_OTHER): Payer: Self-pay

## 2024-07-23 ENCOUNTER — Other Ambulatory Visit: Payer: Self-pay

## 2024-07-23 VITALS — BP 99/68 | HR 81 | Ht 61.0 in | Wt 142.0 lb

## 2024-07-23 DIAGNOSIS — M79602 Pain in left arm: Secondary | ICD-10-CM | POA: Diagnosis not present

## 2024-07-23 DIAGNOSIS — M7522 Bicipital tendinitis, left shoulder: Secondary | ICD-10-CM | POA: Diagnosis not present

## 2024-07-23 MED ORDER — PREDNISONE 10 MG (21) PO TBPK: ORAL_TABLET | ORAL | 0 refills | Status: DC

## 2024-07-23 NOTE — Progress Notes (Signed)
 Orthopaedic Clinic Return  Assessment: [name] is a 53 y.o. female with the following: Left arm pain, atraumatic; possible biceps tendonitis   Plan: [name] has pain in the left upper arm.  No specific injury.  She did not fall.  She complains of pain in the posterior upper arm.  No redness in this area.  She also has pain with stress of the biceps tendon.  This may represent biceps tendinitis.  She is unable to take NSAIDs.  I recommended a short course of prednisone .  This was provided for her.  If she continues to have issues, we can consider some ultrasound-guided injections.  She states understanding.  Follow-up as needed.  Meds ordered this encounter  Medications   predniSONE  (STERAPRED UNI-PAK 21 TAB) 10 MG (21) TBPK tablet    Sig: 10 mg DS 12 as directed    Dispense:  48 tablet    Refill:  0    Body mass index is 26.83 kg/m.  Follow-up: Return if symptoms worsen or fail to improve.   Subjective:  Chief Complaint  Patient presents with   Arm Pain    L mid upper arm to the elbow for 6 mos, but in the past 2 wks pain has become more frequent when she lifts anything. Has tried tylenol , patches, and topical rubs unsure how well they work.     History of Present Illness: [name] is a 53 y.o. female who returns to clinic for evaluation of left arm pain.  She states that she has had occasional pains in the left upper arm for the past 6 months.  It has been intermittent.  However, over the past couple weeks, it has gotten worse.  She has tried some Tylenol .  She is also tried some topical treatments.  She is unable to take NSAIDs.  She denies any falls or other injuries.  Review of Systems: No fevers or chills No numbness or tingling No chest pain No shortness of breath No bowel or bladder dysfunction No GI distress No headaches   Objective: BP 99/68   Pulse 81   Ht 5' 1 (1.549 m)   Wt 142 lb (64.4 kg)   LMP 09/18/2017   BMI 26.83 kg/m    Physical Exam:  Alert and oriented.  No acute distress.  Left arm without deformity.  No swelling.  No point tenderness.  She has full range of motion of the elbow.  She is good range of motion of her left shoulder.  Mild discomfort in the posterior aspect of the upper arm.  Tenderness to palpation over the biceps tendon and the anterior shoulder.  Positive O'Brien's.  She has some pain in the biceps with supraspinatus strength testing.  IMAGING: I personally ordered and reviewed the following images:  X-rays of the left elbow were obtained in clinic today.  No acute injuries are noted.  Minimal degenerative changes.  No dislocation.  No bony lesions.  Impression: Negative left elbow x-ray   Oneil DELENA Horde, MD 07/23/2024 9:24 AM

## 2024-07-28 ENCOUNTER — Other Ambulatory Visit: Payer: Self-pay

## 2024-07-28 ENCOUNTER — Other Ambulatory Visit (HOSPITAL_COMMUNITY): Payer: Self-pay

## 2024-07-30 ENCOUNTER — Encounter (HOSPITAL_COMMUNITY): Payer: Self-pay | Admitting: Pharmacist

## 2024-07-30 ENCOUNTER — Other Ambulatory Visit (HOSPITAL_COMMUNITY): Payer: Self-pay

## 2024-08-01 ENCOUNTER — Other Ambulatory Visit: Payer: Self-pay | Admitting: Medical Genetics

## 2024-08-03 ENCOUNTER — Other Ambulatory Visit (HOSPITAL_COMMUNITY)
Admission: RE | Admit: 2024-08-03 | Discharge: 2024-08-03 | Disposition: A | Discharge status: Home / Self Care | Payer: Self-pay | Source: Ambulatory Visit | Attending: Medical Genetics | Admitting: Medical Genetics

## 2024-08-06 ENCOUNTER — Encounter: Payer: Self-pay | Admitting: Radiology

## 2024-08-10 LAB — GENECONNECT MOLECULAR SCREEN: Genetic Analysis Overall Interpretation: NEGATIVE

## 2024-08-25 ENCOUNTER — Other Ambulatory Visit: Payer: Self-pay

## 2024-08-25 ENCOUNTER — Other Ambulatory Visit (HOSPITAL_BASED_OUTPATIENT_CLINIC_OR_DEPARTMENT_OTHER): Payer: Self-pay

## 2024-08-25 ENCOUNTER — Other Ambulatory Visit (HOSPITAL_COMMUNITY): Payer: Self-pay

## 2024-08-26 ENCOUNTER — Other Ambulatory Visit (HOSPITAL_COMMUNITY): Payer: Self-pay

## 2024-08-27 ENCOUNTER — Other Ambulatory Visit (HOSPITAL_COMMUNITY): Payer: Self-pay

## 2024-08-28 ENCOUNTER — Encounter (HOSPITAL_COMMUNITY): Payer: Self-pay

## 2024-08-30 ENCOUNTER — Other Ambulatory Visit (HOSPITAL_COMMUNITY): Payer: Self-pay

## 2024-09-01 ENCOUNTER — Encounter (HOSPITAL_COMMUNITY): Payer: Self-pay

## 2024-09-02 ENCOUNTER — Other Ambulatory Visit (HOSPITAL_COMMUNITY): Payer: Self-pay

## 2024-09-14 ENCOUNTER — Encounter (HOSPITAL_COMMUNITY): Payer: Self-pay

## 2024-09-15 ENCOUNTER — Other Ambulatory Visit (HOSPITAL_COMMUNITY): Payer: Self-pay

## 2024-10-18 ENCOUNTER — Encounter: Payer: Self-pay | Admitting: Radiology

## 2024-11-16 ENCOUNTER — Ambulatory Visit: Admitting: Family Medicine

## 2024-11-16 ENCOUNTER — Other Ambulatory Visit: Payer: Self-pay

## 2024-11-16 ENCOUNTER — Other Ambulatory Visit (HOSPITAL_COMMUNITY): Payer: Self-pay

## 2024-11-16 ENCOUNTER — Encounter: Payer: Self-pay | Admitting: Family Medicine

## 2024-11-16 VITALS — BP 126/78 | HR 87 | Ht 62.0 in | Wt 164.4 lb

## 2024-11-16 DIAGNOSIS — E66811 Obesity, class 1: Secondary | ICD-10-CM | POA: Diagnosis not present

## 2024-11-16 DIAGNOSIS — Z6830 Body mass index (BMI) 30.0-30.9, adult: Secondary | ICD-10-CM

## 2024-11-16 DIAGNOSIS — F5101 Primary insomnia: Secondary | ICD-10-CM

## 2024-11-16 DIAGNOSIS — R632 Polyphagia: Secondary | ICD-10-CM

## 2024-11-16 DIAGNOSIS — F418 Other specified anxiety disorders: Secondary | ICD-10-CM

## 2024-11-16 DIAGNOSIS — Z903 Acquired absence of stomach [part of]: Secondary | ICD-10-CM

## 2024-11-17 ENCOUNTER — Other Ambulatory Visit: Payer: Self-pay

## 2024-11-21 ENCOUNTER — Encounter: Payer: Self-pay | Admitting: Family Medicine

## 2024-11-21 ENCOUNTER — Other Ambulatory Visit (HOSPITAL_COMMUNITY): Payer: Self-pay

## 2024-11-21 MED ORDER — TRAZODONE HCL 50 MG PO TABS
50.0000 mg | ORAL_TABLET | Freq: Every day | ORAL | 0 refills | Status: AC | PRN
  Filled 2024-11-21 – 2024-12-02 (×3): qty 90, 90d supply, fill #0

## 2024-11-21 MED ORDER — SERTRALINE HCL 100 MG PO TABS
100.0000 mg | ORAL_TABLET | Freq: Every day | ORAL | 0 refills | Status: AC
  Filled 2024-11-21 – 2024-12-02 (×3): qty 90, 90d supply, fill #0

## 2024-11-21 MED ORDER — ZOLPIDEM TARTRATE 10 MG PO TABS
10.0000 mg | ORAL_TABLET | Freq: Every day | ORAL | 0 refills | Status: AC
  Filled 2024-11-21 – 2024-12-02 (×3): qty 90, 90d supply, fill #0

## 2024-11-21 MED ORDER — PHENTERMINE HCL 37.5 MG PO TABS
18.7500 mg | ORAL_TABLET | Freq: Two times a day (BID) | ORAL | 0 refills | Status: AC
  Filled 2024-11-21 – 2024-12-02 (×3): qty 90, 90d supply, fill #0

## 2024-11-21 MED ORDER — BUPROPION HCL ER (XL) 300 MG PO TB24
300.0000 mg | ORAL_TABLET | Freq: Every day | ORAL | 0 refills | Status: AC
  Filled 2024-11-21 – 2024-12-02 (×3): qty 90, 90d supply, fill #0

## 2024-11-21 NOTE — Progress Notes (Signed)
 1. Polyphagia   2. Depression with anxiety   3. Primary insomnia   4. History of sleeve gastrectomy, 11/2017   5. Class 1 obesity with serious comorbidity and body mass index (BMI) of 30.0 to 30.9 in adult, unspecified obesity type    Meds ordered this encounter  Medications   buPROPion  (WELLBUTRIN  XL) 300 MG 24 hr tablet    Sig: Take 1 tablet (300 mg total) by mouth daily.    Dispense:  90 tablet    Refill:  0   zolpidem  (AMBIEN ) 10 MG tablet    Sig: Take 1 tablet (10 mg total) by mouth at bedtime.    Dispense:  90 tablet    Refill:  0   traZODone  (DESYREL ) 50 MG tablet    Sig: Take 1 tablet (50 mg total) by mouth once daily as needed.    Dispense:  90 tablet    Refill:  0   phentermine  (ADIPEX-P ) 37.5 MG tablet    Sig: Take 0.5 tablets (18.75 mg total) by mouth 2 (two) times daily.    Dispense:  90 tablet    Refill:  0   sertraline  (ZOLOFT ) 100 MG tablet    Sig: Take 1 tablet (100 mg total) by mouth daily.    Dispense:  90 tablet    Refill:  0    Assessment and Plan Assessment & Plan Other specified anxiety disorder Experiencing significant anger and resentment, particularly in her marriage. Described a sudden shift in emotions as a 'switch flipped'. Regular physical activity benefits her mood. - Continue Wellbutrin , Zoloft , and Qulipta . - Encouraged regular physical activity.  Insomnia Currently taking Ambien  and trazodone . - Continue Ambien  and trazodone . - Encouraged regular physical activity.  Polyphagia Hunger cues elevated due to lack of GLP1RA.  Geni Shutter, DO, MS, FAAFP, Dipl. [facility] [address] [place], [address] Dept: [phone] Dept Fax: [phone]  Subjective:   Patient is well-known to me from previous care setting and is establishing care in this system with me as PCP. Prior records reviewed when available. Chart updated today with reconciliation of problem list, medications,  allergies, and relevant history. Preventive care and chronic disease status reviewed. Portions of historical chart may remain incomplete; will update on an ongoing basis as clinically indicated.  Discussed the use of AI scribe software for clinical note transcription with the patient, who gave verbal consent to proceed.  History of Present Illness [name] is a 53 year old female who presents for a follow-up visit to discuss her current medications and life updates.  Medication management - Currently taking Wellbutrin , Ambien , trazodone , phentermine , Zoloft , and Qulipta  - Unable to start Mounjaro  until January due to financial constraints related to her healthcare spending card  Mood and exercise activity - Recent return to gym attendance has positively impacted mood - Previous decline in gym attendance attributed to changes in class times, a trainer quitting, and an ankle injury  Review of Systems: Negative, with the exception of above mentioned in HPI.  Current Outpatient Medications:    acetaminophen  (TYLENOL ) 500 MG tablet, Take 500 mg by mouth every 6 (six) hours as needed (pain.)., Disp: , Rfl:    ALPRAZolam  (XANAX ) 0.5 MG tablet, Take 1 tablet (0.5 mg total) by mouth 2 (two) times daily as needed, Disp: 180 tablet, Rfl: 0   Atogepant  (QULIPTA ) 60 MG TABS, Take 1 tablet (60 mg total) by mouth daily., Disp: 90 tablet, Rfl: 0  Cyanocobalamin  (VITAMIN B-12 PO), Take 1,000 mcg by mouth in the morning., Disp: , Rfl:    ergocalciferol  (VITAMIN D2) 1.25 MG (50000 UT) capsule, Take 1 capsule by mouth once a week., Disp: 12 capsule, Rfl: 0   levocetirizine (XYZAL ) 5 MG tablet, Take 5 mg by mouth every evening., Disp: , Rfl:    linaclotide  (LINZESS ) 145 MCG CAPS capsule, Take 1 capsule (145 mcg total) by mouth daily as needed., Disp: 90 capsule, Rfl: 3   Multiple Vitamin (MULTIVITAMIN WITH MINERALS) TABS tablet, Take 1 tablet by mouth in the morning., Disp: , Rfl:    nystatin  ointment  (MYCOSTATIN ), Apply 1 Application topically to the affected area 2 (two) times daily as needed, Disp: 15 g, Rfl: 2   buPROPion  (WELLBUTRIN  XL) 300 MG 24 hr tablet, Take 1 tablet (300 mg total) by mouth daily., Disp: 90 tablet, Rfl: 0   phentermine  (ADIPEX-P ) 37.5 MG tablet, Take 0.5 tablets (18.75 mg total) by mouth 2 (two) times daily., Disp: 90 tablet, Rfl: 0   sertraline  (ZOLOFT ) 100 MG tablet, Take 1 tablet (100 mg total) by mouth daily., Disp: 90 tablet, Rfl: 0   tirzepatide  (MOUNJARO ) 15 MG/0.5ML Pen, Inject 15 mg into the skin once a week. (Patient not taking: Reported on 11/16/2024), Disp: 2 mL, Rfl: 0   traZODone  (DESYREL ) 50 MG tablet, Take 1 tablet (50 mg total) by mouth once daily as needed., Disp: 90 tablet, Rfl: 0   zolpidem  (AMBIEN ) 10 MG tablet, Take 1 tablet (10 mg total) by mouth at bedtime., Disp: 90 tablet, Rfl: 0   Objective:   BP 126/78 (BP Location: Left Arm, Cuff Size: Large)   Pulse 87   Ht 5' 2 (1.575 m)   Wt 164 lb 6.4 oz (74.6 kg)   LMP 09/18/2017   SpO2 98%   BMI 30.07 kg/m   Wt Readings from Last 3 Encounters:  11/16/24 164 lb 6.4 oz (74.6 kg)  07/23/24 142 lb (64.4 kg)  02/26/23 134 lb (60.8 kg)   Physical Exam Constitutional:      General: She is not in acute distress.    Appearance: She is well-developed.  HENT:     Head: Normocephalic and atraumatic.  Eyes:     Conjunctiva/sclera: Conjunctivae normal.  Cardiovascular:     Rate and Rhythm: Normal rate and regular rhythm.     Heart sounds: Normal heart sounds.  Pulmonary:     Effort: Pulmonary effort is normal.     Breath sounds: Normal breath sounds.  Neurological:     General: No focal deficit present.     Mental Status: She is alert.  Psychiatric:        Behavior: Behavior normal.    07/09/24 Lab Summary  TSH 1.44 - Normal Vitamin B1 115 - Normal Vitamin D  31.5 - Low-normal Magnesium 2.3 - Normal Vitamin B12 475 - Normal  Iron Panel: Iron 74, TIBC 332, Transferrin 237, Saturation  22%, Ferritin 50.8 -- All normal  CMP: Electrolytes, renal function (Cr 0.62, eGFR 107), liver enzymes (AST 17, ALT 7), glucose 81 -- Normal  CBC: WBC 5.5, Hgb 12.3, Hct 35.4, RBC 3.99 -- Borderline low RBC/Hct; otherwise normal indices and platelets.

## 2024-11-22 ENCOUNTER — Other Ambulatory Visit: Payer: Self-pay

## 2024-11-24 ENCOUNTER — Ambulatory Visit: Admitting: Family Medicine

## 2024-11-25 ENCOUNTER — Other Ambulatory Visit: Payer: Self-pay

## 2024-11-30 ENCOUNTER — Other Ambulatory Visit (HOSPITAL_COMMUNITY): Payer: Self-pay

## 2024-12-01 ENCOUNTER — Other Ambulatory Visit (HOSPITAL_COMMUNITY): Payer: Self-pay

## 2024-12-01 ENCOUNTER — Other Ambulatory Visit: Payer: Self-pay

## 2024-12-02 ENCOUNTER — Other Ambulatory Visit: Payer: Self-pay

## 2024-12-02 ENCOUNTER — Other Ambulatory Visit (HOSPITAL_COMMUNITY): Payer: Self-pay

## 2024-12-07 ENCOUNTER — Other Ambulatory Visit (HOSPITAL_COMMUNITY): Payer: Self-pay

## 2024-12-07 ENCOUNTER — Other Ambulatory Visit: Payer: Self-pay

## 2024-12-07 MED ORDER — LINZESS 72 MCG PO CAPS
72.0000 ug | ORAL_CAPSULE | Freq: Every day | ORAL | 0 refills | Status: AC | PRN
  Filled 2024-12-07: qty 30, 30d supply, fill #0

## 2024-12-10 ENCOUNTER — Other Ambulatory Visit (HOSPITAL_COMMUNITY): Payer: Self-pay

## 2024-12-10 ENCOUNTER — Other Ambulatory Visit: Payer: Self-pay | Admitting: Family Medicine

## 2024-12-11 MED ORDER — ALPRAZOLAM 0.5 MG PO TABS
0.5000 mg | ORAL_TABLET | Freq: Two times a day (BID) | ORAL | 0 refills | Status: AC
  Filled 2024-12-11: qty 180, 90d supply, fill #0

## 2024-12-12 ENCOUNTER — Other Ambulatory Visit (HOSPITAL_COMMUNITY): Payer: Self-pay

## 2024-12-28 ENCOUNTER — Other Ambulatory Visit: Payer: Self-pay

## 2024-12-28 ENCOUNTER — Ambulatory Visit: Admitting: Family Medicine

## 2024-12-28 ENCOUNTER — Other Ambulatory Visit (HOSPITAL_COMMUNITY): Payer: Self-pay

## 2024-12-28 ENCOUNTER — Encounter: Payer: Self-pay | Admitting: Family Medicine

## 2024-12-28 VITALS — BP 118/74 | HR 74 | Ht 61.0 in | Wt 157.2 lb

## 2024-12-28 DIAGNOSIS — G4733 Obstructive sleep apnea (adult) (pediatric): Secondary | ICD-10-CM

## 2024-12-28 DIAGNOSIS — Z6829 Body mass index (BMI) 29.0-29.9, adult: Secondary | ICD-10-CM | POA: Diagnosis not present

## 2024-12-28 DIAGNOSIS — R632 Polyphagia: Secondary | ICD-10-CM

## 2024-12-28 DIAGNOSIS — F4321 Adjustment disorder with depressed mood: Secondary | ICD-10-CM

## 2024-12-28 DIAGNOSIS — E663 Overweight: Secondary | ICD-10-CM

## 2024-12-28 DIAGNOSIS — Z903 Acquired absence of stomach [part of]: Secondary | ICD-10-CM

## 2024-12-28 DIAGNOSIS — K5909 Other constipation: Secondary | ICD-10-CM | POA: Diagnosis not present

## 2024-12-28 MED ORDER — MOUNJARO 15 MG/0.5ML ~~LOC~~ SOAJ
15.0000 mg | SUBCUTANEOUS | 0 refills | Status: AC
  Filled 2024-12-28: qty 2, 28d supply, fill #0

## 2024-12-28 NOTE — Progress Notes (Incomplete)
 "    Patient Care Team: Prentiss Frieze, DO as PCP - General (Family Medicine)  Weight Management:   Starting weight: 195lb Starting date: 07/10/2021 Today's weight: 157lb Today's date: 12/28/2024 Total lbs lost to date: 38lb Total lbs lost since last in-office visit: 7lb Total weight loss percentage to date: -19.49% There is no height or weight on file to calculate BMI.   Resting Metabolic Rate: *** Nutrition Plan: {EW NUTRITION HNJOD:65522}. Anti-obesity medications (including off-label): ***. Reported side effects: ***. Hunger is {EWCONTROLASSESSMENT:24261}. Cravings are {EWCONTROLASSESSMENT:24261}. Activity: {MWM EXERCISE RECS:23473}. Sleep: Number of hours slept each night: ***. Sleep {ACTION; IS/IS NOT:21021397} restful.   Diagnoses and Orders:   No diagnosis found. No orders of the defined types were placed in this encounter.  No orders of the defined types were placed in this encounter.  Assessment & Plan:   Assessment and Plan Assessment & Plan     Frieze Prentiss, DO, MS, FAAFP, Dipl. [facility] [address] [place], [address] Dept: [phone] Dept Fax: [phone]  Subjective:   History of Present Illness   Review of Systems: Negative, with the exception of above mentioned in HPI.  History:   Reviewed by clinician on day of visit: allergies, medications, problem list, medical history, surgical history, family history, social history, and previous encounter notes.  Medications:   Show/hide medication list[1] Allergies[2]  Objective:   LMP 09/18/2017  {Insert last BP/Wt (optional):23777}{See vitals history (optional):1}   Physical Exam {Insert previous labs (optional):23779} {See past labs  Heme  Chem  Endocrine  Serology  Results Review (optional):1}  Results for orders placed or performed during the hospital encounter of 08/03/24  GeneConnect Molecular Screen - Blood ([facility])   Collection Time: 08/03/24  2:00 PM  Result Value Ref Range   Genetic Analysis Overall Interpretation Negative    Genetic Disease Assessed      This is a screening test and does not detect all pathogenic or likely pathogenic variant(s) in the tested genes; diagnostic testing is recommended for individuals with a personal or family history of heart disease or hereditary cancer. [employer] Tier One  Population Screen is a screening test that analyzes 11 genes related to hereditary breast and ovarian cancer (HBOC) syndrome, Lynch syndrome, and familial hypercholesterolemia. This test only reports clinically significant pathogenic and likely  pathogenic variants but does not report variants of uncertain significance (VUS). In addition, analysis of the PMS2 gene excludes exons 11-15, which overlap with a known pseudogene (PMS2CL).    Genetic Analysis Report      No pathogenic or likely pathogenic variants were detected in the genes analyzed by this test.Genetic test results should be interpreted in the context of an individual's personal medical and family history. Alteration to medical management is NOT  recommended based solely on this result. Clinical correlation is advised.Additional Considerations- This is a screening test; individuals may still carry pathogenic or likely pathogenic variant(s) in the tested genes that are not detected by this test.-  For individuals at risk for these or other related conditions based on factors including personal or family history, diagnostic testing is recommended.- The absence of pathogenic or likely pathogenic variant(s) in the analyzed genes, while reassuring,  does not eliminate the possibility of a hereditary condition; there are other variants and genes associated with heart disease and hereditary cancer that are not included in this test.    Genes Tested See Notes    Disclaimer See Notes  Sequencing Location See Notes    Interpretation  Methods and Limitations See Notes     12/28/2024    PHQ2-9 Depression Screening   Little interest or pleasure in doing things    Feeling down, depressed, or hopeless    PHQ-2 - Total Score    Trouble falling or staying asleep, or sleeping too much    Feeling tired or having little energy    Poor appetite or overeating     Feeling bad about yourself - or that you are a failure or have let yourself or your family down    Trouble concentrating on things, such as reading the newspaper or watching television    Moving or speaking so slowly that other people could have noticed.  Or the opposite - being so fidgety or restless that you have been moving around a lot more than usual    Thoughts that you would be better off dead, or hurting yourself in some way    PHQ2-9 Total Score    If you checked off any problems, how difficult have these problems made it for you to do your work, take care of things at home, or get along with other people    Depression Interventions/Treatment         11/16/2024    2:31 PM 07/04/2022   11:36 AM 07/27/2020    8:36 AM  GAD 7 : Generalized Anxiety Score  Nervous, Anxious, on Edge 2 2 2   Control/stop worrying 1 0 1  Worry too much - different things 2 2 1   Trouble relaxing 2 1 1   Restless 0 0 1  Easily annoyed or irritable 2 1 1   Afraid - awful might happen 0 0 1  Total GAD 7 Score 9 6 8   Anxiety Difficulty Somewhat difficult     Attestations:   {EW ATTESTATIONS:34266}  Geni Shutter, DO, MS, FAAFP, Dipl. [facility] [address] [place], [address] Dept: [phone] Dept Fax: [phone]     [1]  Outpatient Medications Prior to Visit  Medication Sig   acetaminophen  (TYLENOL ) 500 MG tablet Take 500 mg by mouth every 6 (six) hours as needed (pain.).   ALPRAZolam  (XANAX ) 0.5 MG tablet Take 1 tablet (0.5 mg total) by mouth 2 (two) times daily as needed   Atogepant  (QULIPTA ) 60 MG TABS Take 1 tablet  (60 mg total) by mouth daily.   buPROPion  (WELLBUTRIN  XL) 300 MG 24 hr tablet Take 1 tablet (300 mg total) by mouth daily.   Cyanocobalamin  (VITAMIN B-12 PO) Take 1,000 mcg by mouth in the morning.   ergocalciferol  (VITAMIN D2) 1.25 MG (50000 UT) capsule Take 1 capsule by mouth once a week.   levocetirizine (XYZAL ) 5 MG tablet Take 5 mg by mouth every evening.   linaclotide  (LINZESS ) 72 MCG capsule Take 1 capsule (72 mcg total) by mouth daily as needed.   Multiple Vitamin (MULTIVITAMIN WITH MINERALS) TABS tablet Take 1 tablet by mouth in the morning.   nystatin  ointment (MYCOSTATIN ) Apply 1 Application topically to the affected area 2 (two) times daily as needed   phentermine  (ADIPEX-P ) 37.5 MG tablet Take 0.5 tablets (18.75 mg total) by mouth 2 (two) times daily.   sertraline  (ZOLOFT ) 100 MG tablet Take 1 tablet (100 mg total) by mouth daily.   tirzepatide  (MOUNJARO ) 15 MG/0.5ML Pen Inject 15 mg into the skin once a week. (Patient not taking: Reported on 11/16/2024)   traZODone  (DESYREL ) 50 MG tablet Take 1  tablet (50 mg total) by mouth once daily as needed.   zolpidem  (AMBIEN ) 10 MG tablet Take 1 tablet (10 mg total) by mouth at bedtime.   No facility-administered medications prior to visit.  [2]  Allergies Allergen Reactions   Nsaids     Cannot take NSAIDS hx gastric bypass surg   "

## 2025-01-10 ENCOUNTER — Encounter: Payer: Self-pay | Admitting: Family Medicine

## 2025-01-10 DIAGNOSIS — Z903 Acquired absence of stomach [part of]: Secondary | ICD-10-CM | POA: Insufficient documentation

## 2025-01-10 DIAGNOSIS — R632 Polyphagia: Secondary | ICD-10-CM | POA: Insufficient documentation

## 2025-01-10 DIAGNOSIS — K5909 Other constipation: Secondary | ICD-10-CM | POA: Insufficient documentation

## 2025-01-10 DIAGNOSIS — E663 Overweight: Secondary | ICD-10-CM | POA: Insufficient documentation

## 2025-01-10 DIAGNOSIS — F4321 Adjustment disorder with depressed mood: Secondary | ICD-10-CM | POA: Insufficient documentation

## 2025-01-10 NOTE — Progress Notes (Signed)
 "    Patient Care Team: Prentiss Frieze, DO as PCP - General (Family Medicine)  Weight Management:   Starting weight: 195lb Starting date: 07/10/2021 Today's weight: 157lb Today's date: 01/10/2025 Total lbs lost to date: 38lb Total lbs lost since last in-office visit: 7lb Total weight loss percentage to date: -19.49% Body mass index is 29.7 kg/m.   Nutrition Plan: Eat protein with every meal/snack , Prioritize whole, minimally processed foods , Mindful portions, and Address emotional or stress-related eating. Anti-obesity medications (including off-label): phentermine . Reported side effects: none. Hunger is moderately controlled. Cravings are moderately controlled. Activity: Getting back to the gym.  Patient is under the care of an Obesity Medicine-certified physician providing longitudinal care using a comprehensive, pillar-based obesity treatment model (nutrition therapy, physical activity counseling, behavioral modification, pharmacotherapy when indicated, and medical monitoring), consistent with standards outlined by the Obesity Medicine Association. Obesity is treated as a medical disease, not a cosmetic condition. Medication choice is supported by evidence demonstrating improvement in weight, cardiometabolic risk, and obesity-related complications.  Medication is prescribed for treatment of chronic obesity disease and associated medical conditions, not for cosmetic weight loss.   Diagnoses and Orders:   1. Polyphagia   2. Chronic constipation   3. Adjustment disorder with depressed mood in remission   4. History of sleeve gastrectomy, 11/2017   5. Obstructive sleep apnea   6. Overweight with body mass index (BMI) of 29 to 29.9 in adult    Meds ordered this encounter  Medications   tirzepatide  (MOUNJARO ) 15 MG/0.5ML Pen    Sig: Inject 15 mg into the skin once a week.    Dispense:  2 mL    Refill:  0   Assessment & Plan:   Assessment & Plan Depression with  anxiety Improved thought processes and stress management. 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 - Continue Alprazolam  0.5 mg oral twice daily as needed for anxiety.  Obesity BMI 29, improved from previous levels. Active in weight management with physical activity and dietary modifications. - Continue current weight management strategies, including physical activity and dietary modifications.  Chronic constipation Managed with Linaclotide  as needed. - Continue Linaclotide  145 mcg oral daily as needed for constipation.  Subjective:   History of Present Illness [name] is a 54 year old female who presents for a follow-up visit to discuss her current health status and medication management.  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  History:   Reviewed by clinician on day of visit: allergies, medications, problem list, medical history, surgical history, family history, social history, and previous encounter notes.  Medications:   Show/hide medication list[1] Allergies[2]  Objective:   BP 118/74 (BP Location: Right Arm, Cuff Size: Normal)   Pulse 74   Ht 5' 1 (1.549 m)   Wt 157 lb 3.2 oz (71.3 kg)   LMP 09/18/2017   SpO2 99%   BMI 29.70 kg/m   Physical Exam Constitutional:      General: She is not in acute distress.    Appearance: She  is well-developed.  HENT:     Head: Normocephalic and atraumatic.  Eyes:      Conjunctiva/sclera: Conjunctivae normal.  Cardiovascular:     Rate and Rhythm: Normal rate and regular rhythm.     Heart sounds: Normal heart sounds.  Pulmonary:     Effort: Pulmonary effort is normal.     Breath sounds: Normal breath sounds.  Neurological:     General: No focal deficit present.     Mental Status: She is alert.  Psychiatric:        Behavior: Behavior normal.    Attestations:   Reviewed by clinician on day of visit: allergies, medications, problem list, medical history, surgical history, family history, social history, and previous encounter notes. As the patient's primary care physician, board-certified in Family Medicine and Obesity Medicine, I am providing ongoing, comprehensive obesity care based on the pillars of obesity medicine, including nutrition therapy, physical activity, behavioral modification, and pharmacologic treatment.      [1]  Outpatient Medications Prior to Visit  Medication Sig   acetaminophen  (TYLENOL ) 500 MG tablet Take 500 mg by mouth every 6 (six) hours as needed (pain.).   ALPRAZolam  (XANAX ) 0.5 MG tablet Take 1 tablet (0.5 mg total) by mouth 2 (two) times daily as needed   Atogepant  (QULIPTA ) 60 MG TABS Take 1 tablet (60 mg total) by mouth daily.   buPROPion  (WELLBUTRIN  XL) 300 MG 24 hr tablet Take 1 tablet (300 mg total) by mouth daily.   Cyanocobalamin  (VITAMIN B-12 PO) Take 1,000 mcg by mouth in the morning.   ergocalciferol  (VITAMIN D2) 1.25 MG (50000 UT) capsule Take 1 capsule by mouth once a week.   levocetirizine (XYZAL ) 5 MG tablet Take 5 mg by mouth every evening.   linaclotide  (LINZESS ) 72 MCG capsule Take 1 capsule (72 mcg total) by mouth daily as needed.   Multiple Vitamin (MULTIVITAMIN WITH MINERALS) TABS tablet Take 1 tablet by mouth in the morning.   nystatin  ointment (MYCOSTATIN ) Apply 1 Application topically to the affected area 2 (two) times daily as needed   phentermine  (ADIPEX-P ) 37.5 MG tablet Take 0.5 tablets (18.75 mg  total) by mouth 2 (two) times daily.   sertraline  (ZOLOFT ) 100 MG tablet Take 1 tablet (100 mg total) by mouth daily.   traZODone  (DESYREL ) 50 MG tablet Take 1 tablet (50 mg total) by mouth once daily as needed.   zolpidem  (AMBIEN ) 10 MG tablet Take 1 tablet (10 mg total) by mouth at bedtime.   [DISCONTINUED] tirzepatide  (MOUNJARO ) 15 MG/0.5ML Pen Inject 15 mg into the skin once a week. (Patient not taking: Reported on 12/28/2024)   No facility-administered medications prior to visit.  [2]  Allergies Allergen Reactions   Nsaids     Cannot take NSAIDS hx gastric bypass surg   "

## 2025-02-08 ENCOUNTER — Ambulatory Visit: Admitting: Family Medicine
